# Patient Record
Sex: Male | Born: 1985 | Marital: Married | Smoking: Former smoker
Health system: Southern US, Community
[De-identification: ages and names within clinical notes are randomized; demographics above are authoritative.]

## PROBLEM LIST (undated history)

## (undated) DIAGNOSIS — E669 Obesity, unspecified: Secondary | ICD-10-CM

## (undated) DIAGNOSIS — E785 Hyperlipidemia, unspecified: Secondary | ICD-10-CM

## (undated) DIAGNOSIS — R0683 Snoring: Secondary | ICD-10-CM

## (undated) DIAGNOSIS — Z8679 Personal history of other diseases of the circulatory system: Secondary | ICD-10-CM

## (undated) DIAGNOSIS — G4733 Obstructive sleep apnea (adult) (pediatric): Secondary | ICD-10-CM

## (undated) DIAGNOSIS — E119 Type 2 diabetes mellitus without complications: Secondary | ICD-10-CM

## (undated) HISTORY — DX: Snoring: R06.83

## (undated) HISTORY — DX: Type 2 diabetes mellitus without complications: E11.9

## (undated) HISTORY — DX: Obesity, unspecified: E66.9

## (undated) HISTORY — DX: Obstructive sleep apnea (adult) (pediatric): G47.33

## (undated) HISTORY — PX: OTHER SURGICAL HISTORY: SHX169

## (undated) HISTORY — DX: Personal history of other diseases of the circulatory system: Z86.79

## (undated) HISTORY — DX: Hyperlipidemia, unspecified: E78.5

---
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
## 2015-12-29 DIAGNOSIS — Z8679 Personal history of other diseases of the circulatory system: Secondary | ICD-10-CM

## 2015-12-29 HISTORY — DX: Personal history of other diseases of the circulatory system: Z86.79

## 2017-08-03 DIAGNOSIS — G4730 Sleep apnea, unspecified: Secondary | ICD-10-CM | POA: Diagnosis not present

## 2017-08-03 DIAGNOSIS — Z7689 Persons encountering health services in other specified circumstances: Secondary | ICD-10-CM | POA: Diagnosis not present

## 2017-08-03 DIAGNOSIS — R600 Localized edema: Secondary | ICD-10-CM | POA: Diagnosis not present

## 2017-08-04 DIAGNOSIS — Z7689 Persons encountering health services in other specified circumstances: Secondary | ICD-10-CM | POA: Diagnosis not present

## 2017-08-04 DIAGNOSIS — R600 Localized edema: Secondary | ICD-10-CM | POA: Diagnosis not present

## 2017-10-18 DIAGNOSIS — R400 Somnolence: Secondary | ICD-10-CM | POA: Diagnosis not present

## 2017-10-18 DIAGNOSIS — R0683 Snoring: Secondary | ICD-10-CM | POA: Diagnosis not present

## 2017-12-24 ENCOUNTER — Telehealth: Payer: Self-pay

## 2017-12-24 NOTE — Telephone Encounter (Signed)
Spoke with pt. And instructed if he has any shortness of breath or worsening of swelling to go to ED. Verbalizes understanding.

## 2017-12-27 ENCOUNTER — Ambulatory Visit: Payer: Self-pay | Admitting: Family Medicine

## 2018-01-03 ENCOUNTER — Ambulatory Visit: Payer: Self-pay | Admitting: Family Medicine

## 2018-01-03 ENCOUNTER — Ambulatory Visit (HOSPITAL_BASED_OUTPATIENT_CLINIC_OR_DEPARTMENT_OTHER)
Admission: RE | Admit: 2018-01-03 | Discharge: 2018-01-03 | Disposition: A | Discharge status: Home / Self Care | Payer: 59 | Source: Ambulatory Visit | Attending: Medical | Admitting: Medical

## 2018-01-03 ENCOUNTER — Ambulatory Visit (INDEPENDENT_AMBULATORY_CARE_PROVIDER_SITE_OTHER): Payer: 59 | Admitting: Medical

## 2018-01-03 ENCOUNTER — Encounter: Payer: Self-pay | Admitting: Medical

## 2018-01-03 VITALS — BP 135/82 | HR 86 | Temp 98.3°F | Resp 16 | Ht 68.0 in | Wt 376.6 lb

## 2018-01-03 DIAGNOSIS — R0600 Dyspnea, unspecified: Secondary | ICD-10-CM | POA: Diagnosis not present

## 2018-01-03 DIAGNOSIS — R5383 Other fatigue: Secondary | ICD-10-CM | POA: Diagnosis not present

## 2018-01-03 DIAGNOSIS — R0683 Snoring: Secondary | ICD-10-CM

## 2018-01-03 DIAGNOSIS — M25473 Effusion, unspecified ankle: Secondary | ICD-10-CM

## 2018-01-03 DIAGNOSIS — R0602 Shortness of breath: Secondary | ICD-10-CM | POA: Diagnosis not present

## 2018-01-03 DIAGNOSIS — R918 Other nonspecific abnormal finding of lung field: Secondary | ICD-10-CM | POA: Diagnosis not present

## 2018-01-03 LAB — COMPREHENSIVE METABOLIC PANEL
ALT: 27 U/L (ref 0–53)
AST: 19 U/L (ref 0–37)
Albumin: 4.1 g/dL (ref 3.5–5.2)
Alkaline Phosphatase: 67 U/L (ref 39–117)
BUN: 11 mg/dL (ref 6–23)
CHLORIDE: 99 meq/L (ref 96–112)
CO2: 30 meq/L (ref 19–32)
CREATININE: 0.8 mg/dL (ref 0.40–1.50)
Calcium: 9.4 mg/dL (ref 8.4–10.5)
GFR: 119.67 mL/min (ref >60.00)
Glucose, Bld: 143 mg/dL — ABNORMAL HIGH (ref 70–99)
POTASSIUM: 3.7 meq/L (ref 3.5–5.1)
Sodium: 137 mEq/L (ref 135–145)
Total Bilirubin: 0.5 mg/dL (ref 0.2–1.2)
Total Protein: 7.7 g/dL (ref 6.0–8.3)

## 2018-01-03 LAB — T4, FREE: FREE T4: 0.7 ng/dL (ref 0.60–1.60)

## 2018-01-03 LAB — BRAIN NATRIURETIC PEPTIDE: Pro B Natriuretic peptide (BNP): 8 pg/mL (ref 0.0–100.0)

## 2018-01-03 LAB — TSH: TSH: 3.07 u[IU]/mL (ref 0.35–4.50)

## 2018-01-03 NOTE — Progress Notes (Signed)
Subjective:    Patient ID: Mario Livingston, male    DOB: March 10, 1986, 32 y.o.   MRN: [id number]  HPI  Pt in for first time.  Pt is married with one daughter. Works [employer]. Does not exercise.  Pt has some bilateral ankle swelling. Pt was given lasix. But he states urinates too much. When he props his legs up his legs will go down. Pt never got chest xray in past.  Pt has gained some weight over last 2 years has gained some weight. He gained weight when his dad was sick. He was stressed a lot and was eating a lot/poorly at that time. Also now has sedentary job.  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  Pt snores a lot and thinks sleep apnea. He stops breathing at night per his wife. He will dose off sometimes early in am after awaking. Pt had sleep study scheduled but then was canceled at last minute.(study set up by his neurologist but insured declined).  Former smoker and stopped more than a year.  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   Review of Systems  Constitutional: Positive for fatigue. Negative for chills and fever.  Respiratory: Negative for cough, chest tightness, shortness of breath and wheezing.        Snoring.  Cardiovascular: Negative for chest pain and palpitations.  Gastrointestinal: Negative for abdominal pain.  Musculoskeletal: Negative for back pain.       Pedal edema.   Skin: Negative for rash.  Neurological: Negative for dizziness, speech difficulty, weakness, numbness and headaches.  Hematological: Negative for adenopathy. Does not bruise/bleed easily.  Psychiatric/Behavioral: Positive for sleep disturbance. Negative for behavioral problems, confusion, decreased concentration, dysphoric mood and suicidal ideas. The patient is not nervous/anxious.     No past medical history on file.   Social History   Socioeconomic History  . Marital status: Married    Spouse name: Not on file  . Number of children: Not on file  .  Years of education: Not on file  . Highest education level: Not on file  Social Needs  . Financial resource strain: Not on file  . Food insecurity - worry: Not on file  . Food insecurity - inability: Not on file  . Transportation needs - medical: Not on file  . Transportation needs - non-medical: Not on file  Occupational History  . Not on file  Tobacco Use  . Smoking status: Former Games developermoker  . Smokeless tobacco: Never Used  Substance and Sexual Activity  . Alcohol use: No    Frequency: Never  . Drug use: Not on file  . Sexual activity: Not on file  Other Topics Concern  . Not on file  Social History Narrative  . Not on file      Family History  Problem Relation Age of Onset  . Cancer Father   . Hypertension Father   . Hypertension Brother     Not on File  No current outpatient medications on file prior to visit.   No current facility-administered medications on file prior to visit.     BP (!) 141/83   Pulse 86   Temp 98.3 F (36.8 C) (Oral)   Resp 16   Ht 5\' 8"  (1.727 m)   Wt (!) 376 lb 9.6 oz (170.8 kg)   SpO2 98%   BMI 57.26 kg/m     Objective:   Physical Exam   General Mental Status- Alert. General Appearance- Not  in acute distress.   Skin General: Color- Normal Color. Moisture- Normal Moisture.  Neck Carotid Arteries- Normal color. Moisture- Normal Moisture. No carotid bruits. No JVD.  Chest and Lung Exam Auscultation: Breath Sounds:-Normal.  Cardiovascular Auscultation:Rythm- Regular. Murmurs & Other Heart Sounds:Auscultation of the heart reveals- No Murmurs.  Abdomen Inspection:-Inspeection Normal. Palpation/Percussion:Note:No mass. Palpation and Percussion of the abdomen reveal- Non Tender, Non Distended + BS, no rebound or guarding.   Neurologic Cranial Nerve exam:- CN III-XII intact(No nystagmus), symmetric smile. Strength:- 5/5 equal and symmetric strength both upper and lower extremities.   Lower ext- 1-2 + pedal edema  around ankles.    Assessment & Plan:  For your snoring history will refer to pulmonologist for sleep apnea evaluation.  For obesity start weight loss program. Diet and exercise. Might rx med in future or refer to weight loss specialist.  For fatigue will get cbc, cmp tsh and t4.  For sob on activity will get cxr and bnp. Continue lasix.  Follow up 3-4 weeks or as needed  Evrett Hakim, Ramon Dredge, PA-C

## 2018-01-03 NOTE — Patient Instructions (Addendum)
For your snoring history will refer to pulmonologist for sleep apnea evaluation.  For obesity start weight loss program. Diet and exercise. Might rx med in future or refer to weight loss specialist.  For fatigue will get cbc, cmp tsh and t4.  For sob on activivity will get cxr and bnp. Continue lasix.  Follow up 3-4 weeks or as needed

## 2018-01-24 ENCOUNTER — Encounter: Payer: Self-pay | Admitting: Medical

## 2018-01-24 ENCOUNTER — Ambulatory Visit (INDEPENDENT_AMBULATORY_CARE_PROVIDER_SITE_OTHER): Payer: 59 | Admitting: Medical

## 2018-01-24 VITALS — BP 134/78 | HR 87 | Temp 98.3°F | Resp 16 | Ht 68.0 in | Wt 371.6 lb

## 2018-01-24 DIAGNOSIS — R0683 Snoring: Secondary | ICD-10-CM | POA: Diagnosis not present

## 2018-01-24 DIAGNOSIS — R739 Hyperglycemia, unspecified: Secondary | ICD-10-CM

## 2018-01-24 DIAGNOSIS — E785 Hyperlipidemia, unspecified: Secondary | ICD-10-CM

## 2018-01-24 LAB — LIPID PANEL
CHOL/HDL RATIO: 4
CHOLESTEROL: 157 mg/dL (ref 0–200)
HDL: 36.1 mg/dL — ABNORMAL LOW (ref >39.00)
LDL CALC: 110 mg/dL — AB (ref 0–99)
NONHDL: 120.61
Triglycerides: 52 mg/dL (ref 0.0–149.0)
VLDL: 10.4 mg/dL (ref 0.0–40.0)

## 2018-01-24 LAB — HEMOGLOBIN A1C: Hgb A1c MFr Bld: 7.3 % — ABNORMAL HIGH (ref 4.6–6.5)

## 2018-01-24 NOTE — Patient Instructions (Addendum)
For your high sugar and obesity recommend continue to diet and exercise. Will get a1c today. But with increased muscle and loss of excess fat you body should sugar more efficiently and you sugars will be better controlled.  Please attend pulmonologist appointment. I am trying to get that number so you can call their office to coordinate study.  Since you are fasting will go ahead and add lipid panel to your studies.(low level cholesterol elevation and told to use fish oil in the past)  Follow up in 3 months or as needed

## 2018-01-24 NOTE — Progress Notes (Signed)
Subjective:    Patient ID: Mario Livingston, male    DOB: [birth year], 32 y.o.   MRN: [id number]  HPI  Pt in for follow up. Pt sugar was 143 on last visit but was not fasting at the time.   Pt weight at home no clothes was 161 lb this am.   Pt has been working out 7 days a week. Working out 30-45 minutes a day. Pt feeling better and has more energy.   Pt states he is sleeping some better.  Pt bp was mild high last time. But bp much improved today 134/78.   Pt is tyring to coordinate referral to pulmonlogist for his sleep study.   Review of Systems  Constitutional: Negative for chills, fatigue and fever.  HENT: Negative for congestion, drooling and facial swelling.   Respiratory: Negative for cough, chest tightness, shortness of breath and wheezing.   Cardiovascular: Negative for chest pain and palpitations.  Gastrointestinal: Negative for abdominal pain.  Endocrine: Negative for polydipsia, polyphagia and polyuria.  Musculoskeletal: Negative for back pain, joint swelling, myalgias and neck stiffness.  Skin: Negative for rash.  Neurological: Negative for dizziness, seizures, speech difficulty, weakness and headaches.  Psychiatric/Behavioral: Negative for agitation, behavioral problems, confusion, hallucinations, self-injury and suicidal ideas. The patient is not nervous/anxious and is not hyperactive.     Past Medical History:  Diagnosis Date  . History of Wolff-Parkinson-White (WPW) syndrome 2017   ablation surgery resolved in 2017.  . Obesity   . Snoring      Social History   Socioeconomic History  . Marital status: Married    Spouse name: Not on file  . Number of children: Not on file  . Years of education: Not on file  . Highest education level: Not on file  Social Needs  . Financial resource strain: Not on file  . Food insecurity - worry: Not on file  . Food insecurity - inability: Not on file  . Transportation needs - medical: Not on file  .  Transportation needs - non-medical: Not on file  Occupational History  . Not on file  Tobacco Use  . Smoking status: Former Games developer  . Smokeless tobacco: Never Used  Substance and Sexual Activity  . Alcohol use: No    Frequency: Never  . Drug use: Not on file  . Sexual activity: Not on file  Other Topics Concern  . Not on file  Social History Narrative  . Not on file    Past Surgical History:  Procedure Laterality Date  . ANKLE RECONSTRUCTION  2004    Family History  Problem Relation Age of Onset  . Cancer Father   . Hypertension Father   . Hypertension Brother     Not on File  Current Outpatient Medications on File Prior to Visit  Medication Sig Dispense Refill  . furosemide (LASIX) 40 MG tablet Take 40 mg by mouth daily.     No current facility-administered medications on file prior to visit.     BP 134/78   Pulse 87   Temp 98.3 F (36.8 C) (Oral)   Resp 16   Ht 5\' 8"  (1.727 m)   Wt (!) 371 lb 9.6 oz (168.6 kg)   SpO2 97%   BMI 56.50 kg/m       Objective:   Physical Exam  General Mental Status- Alert. General Appearance- Not in acute distress.   Skin General: Color- Normal Color. Moisture- Normal Moisture.  Neck Carotid Arteries- Normal color. Moisture- Normal  Moisture. No carotid bruits. No JVD.  Chest and Lung Exam Auscultation: Breath Sounds:-Normal.  Cardiovascular Auscultation:Rythm- Regular. Murmurs & Other Heart Sounds:Auscultation of the heart reveals- No Murmurs.  Abdomen Inspection:-Inspeection Normal. Palpation/Percussion:Note:No mass. Palpation and Percussion of the abdomen reveal- Non Tender, Non Distended + BS, no rebound or guarding.    Neurologic Cranial Nerve exam:- CN III-XII intact(No nystagmus), symmetric smile. Strength:- 5/5 equal and symmetric strength both upper and lower extremities.      Assessment & Plan:  For your high sugar and obesity recommend continue to diet and exercise. Will get a1c today. But with  increased muscle and loss of excess fat you body should utilize sugar  more efficiently and you sugars will be better controlled.  Please attend pulmonologist appointment. I am trying to get that number so you can call their office to coordinate study.  Since you are fasting will go ahead and add lipid panel to your studies.(low level cholesterol elevation and told to use fish oil in the past)  Follow up in 3 months or as needed  Whole FoodsEdward Demarrius Guerrero, VF CorporationPA-C

## 2018-04-03 DIAGNOSIS — H6642 Suppurative otitis media, unspecified, left ear: Secondary | ICD-10-CM | POA: Diagnosis not present

## 2018-04-22 ENCOUNTER — Institutional Professional Consult (permissible substitution): Payer: 59 | Admitting: Pulmonary Disease

## 2018-05-02 ENCOUNTER — Ambulatory Visit: Payer: 59 | Admitting: Medical

## 2018-06-07 ENCOUNTER — Institutional Professional Consult (permissible substitution): Payer: 59 | Admitting: Pulmonary Disease

## 2018-07-27 ENCOUNTER — Encounter: Payer: Self-pay | Admitting: Pulmonary Disease

## 2018-07-27 ENCOUNTER — Ambulatory Visit (INDEPENDENT_AMBULATORY_CARE_PROVIDER_SITE_OTHER): Payer: 59 | Admitting: Pulmonary Disease

## 2018-07-27 VITALS — BP 138/86 | HR 97 | Ht 68.0 in | Wt 368.8 lb

## 2018-07-27 DIAGNOSIS — Z6841 Body Mass Index (BMI) 40.0 and over, adult: Secondary | ICD-10-CM | POA: Diagnosis not present

## 2018-07-27 DIAGNOSIS — R0683 Snoring: Secondary | ICD-10-CM

## 2018-07-27 NOTE — Progress Notes (Signed)
   Subjective:    Patient ID: Marcha DuttonJoshua Juan Dittrich, male    DOB: [birth year], 32 y.o.   MRN: [id number]  HPI    Review of Systems  Constitutional: Negative for fever and unexpected weight change.  HENT: Negative for congestion, dental problem, ear pain, nosebleeds, postnasal drip, rhinorrhea, sinus pressure, sneezing, sore throat and trouble swallowing.   Eyes: Negative for redness and itching.  Respiratory: Negative for cough, chest tightness, shortness of breath and wheezing.   Cardiovascular: Negative for palpitations and leg swelling.  Gastrointestinal: Negative for nausea and vomiting.  Genitourinary: Negative for dysuria.  Musculoskeletal: Negative for joint swelling.  Skin: Negative for rash.  Allergic/Immunologic: Negative.  Negative for environmental allergies, food allergies and immunocompromised state.  Neurological: Negative for headaches.  Hematological: Does not bruise/bleed easily.  Psychiatric/Behavioral: Negative for dysphoric mood. The patient is not nervous/anxious.        Objective:   Physical Exam        Assessment & Plan:

## 2018-07-27 NOTE — Patient Instructions (Signed)
Will arrange for home sleep study Will call to arrange for follow up after sleep study reviewed  

## 2018-07-27 NOTE — Progress Notes (Signed)
[facility]  Chief Complaint  Patient presents with  . Sleep Consult    Constitutional: BP 138/86 (BP Location: Left Arm, Cuff Size: Normal)   Pulse 97   Ht 5\' 8"  (1.727 m)   Wt (!) 368 lb 12.8 oz (167.3 kg)   SpO2 100%   BMI 56.08 kg/m   History of Present Illness: Mario Livingston is a 32 y.o. male with snoring.  He has noticed trouble with snoring for years.  This has been getting worse.  His wife says he stops breathing.  He can't sleep on his back.  His mouth gets dry at night.  He starts to doze when watching TV.  He goes to sleep at 9 pm.  He falls asleep in 30 minutes.  He wakes up 2 or 3 times to use the bathroom.  He gets out of bed at 345 am.  He feels tired in the morning.  He denies morning headache.  He does not use anything to help him fall sleep or stay awake.  He denies sleep walking, sleep talking, bruxism, or nightmares.  There is no history of restless legs.  He denies sleep hallucinations, sleep paralysis, or cataplexy.  The Epworth score is 10 out of 24.   Comprehensive Respiratory Exam:  Appearance - well kempt  ENMT - nasal mucosa moist, turbinates clear, midline nasal septum, no dental lesions, no gingival bleeding, no oral exudates, no tonsillar hypertrophy, MP 4, enlarged tongue Neck - no masses, trachea midline, no thyromegaly, no elevation in JVP Respiratory - normal appearance of chest wall, normal respiratory effort w/o accessory muscle use, no dullness on percussion, no tactile fremitus, no wheezing or rales CV - s1s2 regular rate and rhythm, no murmurs, no peripheral edema, no varicosities, radial pulses symmetric GI - soft, non tender, no masses, no hepatosplenomegaly Lymph - no adenopathy noted in neck and axillary areas MSK - normal muscle strength and tone, normal gait Ext - no cyanosis, clubbing, or joint inflammation noted Skin - no rashes, lesions, or ulcers Neuro - oriented to person, place,  and time Psych - normal mood and affect  Discussion: He has snoring, sleep disruption, apnea, and daytime sleepiness.  His BMI is > 35.  I am concerned he could have sleep apnea.  Assessment/Plan:   Snoring with excessive daytime sleepiness. - will need to arrange for a home sleep study  Obesity. - discussed how weight can impact sleep and risk for sleep disordered breathing - discussed options to assist with weight loss: combination of diet modification, cardiovascular and strength training exercises  Cardiovascular risk. - had an extensive discussion regarding the adverse health consequences related to untreated sleep disordered breathing - specifically discussed the risks for hypertension, coronary artery disease, cardiac dysrhythmias, cerebrovascular disease, and diabetes - lifestyle modification discussed  Safe driving practices. - discussed how sleep disruption can increase risk of accidents, particularly when driving - safe driving practices were discussed  Therapies for obstructive sleep apnea. - if the sleep study shows significant sleep apnea, then various therapies for treatment were reviewed: CPAP, oral appliance, and surgical interventions    Patient Instructions  Will arrange for home sleep study Will call to arrange for follow up after sleep study reviewed     Coralyn Helling, MD [facility] 07/27/2018, 4:17 PM  Flow Sheet  Sleep tests:  Review of Systems: Constitutional: Negative for fever and unexpected weight change.  HENT: Negative for congestion, dental problem, ear pain, nosebleeds,  postnasal drip, rhinorrhea, sinus pressure, sneezing, sore throat and trouble swallowing.   Eyes: Negative for redness and itching.  Respiratory: Negative for cough, chest tightness, shortness of breath and wheezing.   Cardiovascular: Negative for palpitations and leg swelling.  Gastrointestinal: Negative for nausea and vomiting.  Genitourinary:  Negative for dysuria.  Musculoskeletal: Negative for joint swelling.  Skin: Negative for rash.  Allergic/Immunologic: Negative.  Negative for environmental allergies, food allergies and immunocompromised state.  Neurological: Negative for headaches.  Hematological: Does not bruise/bleed easily.  Psychiatric/Behavioral: Negative for dysphoric mood. The patient is not nervous/anxious.    Past Medical History: He  has a past medical history of History of Wolff-Parkinson-White (WPW) syndrome (2017), Obesity, and Snoring.  Past Surgical History: He  has a past surgical history that includes Ankle reconstruction (2004).  Family History: His family history includes Cancer in his father; Hypertension in his brother and father.  Social History: He  reports that he has quit smoking. He has never used smokeless tobacco. He reports that he does not drink alcohol.  Medications: Allergies as of 07/27/2018   Not on File     Medication List        Accurate as of 07/27/18  4:17 PM. Always use your most recent med list.          ADULT NUTRITIONAL SUPPLEMENT + PO Take 2 capsules by mouth daily. Ketoxol supplement   furosemide 40 MG tablet Commonly known as:  LASIX Take 40 mg by mouth daily.

## 2018-08-16 DIAGNOSIS — G4733 Obstructive sleep apnea (adult) (pediatric): Secondary | ICD-10-CM | POA: Diagnosis not present

## 2018-08-17 ENCOUNTER — Other Ambulatory Visit: Payer: Self-pay | Admitting: *Deleted

## 2018-08-17 DIAGNOSIS — R0683 Snoring: Secondary | ICD-10-CM

## 2018-08-23 ENCOUNTER — Encounter: Payer: Self-pay | Admitting: Pulmonary Disease

## 2018-08-23 ENCOUNTER — Telehealth: Payer: Self-pay | Admitting: Pulmonary Disease

## 2018-08-23 DIAGNOSIS — G4733 Obstructive sleep apnea (adult) (pediatric): Secondary | ICD-10-CM | POA: Diagnosis not present

## 2018-08-23 HISTORY — DX: Obstructive sleep apnea (adult) (pediatric): G47.33

## 2018-08-23 NOTE — Telephone Encounter (Signed)
Called and spoke with patient regarding results.  Informed the patient of results and recommendations today. Scheduled ROV with VS for 08/31/2018 at 2:45pm to discuss HST results. Pt verbalized understanding and denied any questions or concerns at this time.  Nothing further needed.

## 2018-08-23 NOTE — Telephone Encounter (Signed)
HST 08/16/18 >> AHI 81.7, SpO2 low 63%.   Will have my nurse inform pt that sleep study shows severe sleep apnea.  Please schedule ROV with NP to discuss tx options >> auto CPAP versus CPAP titration study.

## 2018-08-31 ENCOUNTER — Ambulatory Visit (INDEPENDENT_AMBULATORY_CARE_PROVIDER_SITE_OTHER): Payer: 59 | Admitting: Pulmonary Disease

## 2018-08-31 ENCOUNTER — Encounter: Payer: Self-pay | Admitting: Pulmonary Disease

## 2018-08-31 VITALS — BP 130/82 | HR 104 | Ht 69.0 in | Wt 376.0 lb

## 2018-08-31 DIAGNOSIS — G4733 Obstructive sleep apnea (adult) (pediatric): Secondary | ICD-10-CM | POA: Diagnosis not present

## 2018-08-31 DIAGNOSIS — Z6841 Body Mass Index (BMI) 40.0 and over, adult: Secondary | ICD-10-CM | POA: Diagnosis not present

## 2018-08-31 NOTE — Patient Instructions (Signed)
Will arrange for auto CPAP set up Follow up in 2 months after CPAP set up 

## 2018-08-31 NOTE — Progress Notes (Signed)
[facility]  Chief Complaint  Patient presents with  . Follow-up    Pt requesting to discuss HST results further in depth.    Constitutional: BP 130/82 (BP Location: Left Arm, Cuff Size: Normal)   Pulse (!) 104   Ht 5\' 9"  (1.753 m)   Wt (!) 376 lb (170.6 kg)   SpO2 97%   BMI 55.53 kg/m   History of Present Illness: Mario Livingston is a 32 y.o. male with obstructive sleep apnea.  He is here to review his sleep study.  Showed severe sleep apnea.  He is working on a plan to get his weight down.  Comprehensive Respiratory Exam:  Appearance - well kempt  ENMT - nasal mucosa moist, turbinates clear, midline nasal septum, no dental lesions, no gingival bleeding, no oral exudates, no tonsillar hypertrophy, MP 4, enlarged tongue Neck - no masses, trachea midline, no thyromegaly, no elevation in JVP Respiratory - normal appearance of chest wall, normal respiratory effort w/o accessory muscle use, no dullness on percussion, no wheezing or rales CV - s1s2 regular rate and rhythm, no murmurs, no peripheral edema, radial pulses symmetric GI - soft, non tender Lymph - no adenopathy noted in neck and axillary areas MSK - normal muscle strength and tone, normal gait Ext - no cyanosis, clubbing, or joint inflammation noted Skin - no rashes, lesions, or ulcers Neuro - oriented to person, place, and time Psych - normal mood and affect  Assessment/Plan:  Obstructive sleep apnea. - reviewed his sleep study with him - discussed different treatment options - will arrange for auto CPAP set up and ONO with CPAP - explained he might need in lab titration study at some point  Obesity. - reviewed different options to assist with weight loss regarding diet modification and gradual exercise regimen   Patient Instructions  Will arrange for auto CPAP set up  Follow up in 2 months after CPAP set up    Coralyn Helling, MD [facility] 08/31/2018, 3:42 PM  Flow Sheet  Sleep tests: HST 08/16/18 >> AHI 81.7, SpO2 low 63%.  Past Medical History: He  has a past medical history of History of Wolff-Parkinson-White (WPW) syndrome (2017), Obesity, OSA (obstructive sleep apnea) (08/23/2018), and Snoring.  Past Surgical History: He  has a past surgical history that includes Ankle reconstruction (2004).  Family History: His family history includes Cancer in his father; Hypertension in his brother and father.  Social History: He  reports that he has quit smoking. He has never used smokeless tobacco. He reports that he does not drink alcohol or use drugs.  Medications: Allergies as of 08/31/2018   Not on File     Medication List        Accurate as of 08/31/18  3:42 PM. Always use your most recent med list.          ADULT NUTRITIONAL SUPPLEMENT + PO Take 2 capsules by mouth daily. Ketoxol supplement   furosemide 40 MG tablet Commonly known as:  LASIX Take 40 mg by mouth daily.

## 2018-09-13 DIAGNOSIS — G4733 Obstructive sleep apnea (adult) (pediatric): Secondary | ICD-10-CM | POA: Diagnosis not present

## 2018-10-13 DIAGNOSIS — G4733 Obstructive sleep apnea (adult) (pediatric): Secondary | ICD-10-CM | POA: Diagnosis not present

## 2018-10-31 ENCOUNTER — Ambulatory Visit: Payer: 59 | Admitting: Pulmonary Disease

## 2018-11-13 DIAGNOSIS — G4733 Obstructive sleep apnea (adult) (pediatric): Secondary | ICD-10-CM | POA: Diagnosis not present

## 2018-11-25 DIAGNOSIS — G4733 Obstructive sleep apnea (adult) (pediatric): Secondary | ICD-10-CM | POA: Diagnosis not present

## 2018-12-13 DIAGNOSIS — G4733 Obstructive sleep apnea (adult) (pediatric): Secondary | ICD-10-CM | POA: Diagnosis not present

## 2019-01-13 DIAGNOSIS — G4733 Obstructive sleep apnea (adult) (pediatric): Secondary | ICD-10-CM | POA: Diagnosis not present

## 2019-02-13 DIAGNOSIS — G4733 Obstructive sleep apnea (adult) (pediatric): Secondary | ICD-10-CM | POA: Diagnosis not present

## 2019-03-14 DIAGNOSIS — G4733 Obstructive sleep apnea (adult) (pediatric): Secondary | ICD-10-CM | POA: Diagnosis not present

## 2019-04-14 DIAGNOSIS — G4733 Obstructive sleep apnea (adult) (pediatric): Secondary | ICD-10-CM | POA: Diagnosis not present

## 2019-04-18 DIAGNOSIS — G4733 Obstructive sleep apnea (adult) (pediatric): Secondary | ICD-10-CM | POA: Diagnosis not present

## 2019-05-14 DIAGNOSIS — G4733 Obstructive sleep apnea (adult) (pediatric): Secondary | ICD-10-CM | POA: Diagnosis not present

## 2020-07-05 ENCOUNTER — Emergency Department (HOSPITAL_BASED_OUTPATIENT_CLINIC_OR_DEPARTMENT_OTHER)
Admission: EM | Admit: 2020-07-05 | Discharge: 2020-07-05 | Disposition: A | Discharge status: Home / Self Care | Payer: 59 | Attending: Emergency Medicine | Admitting: Emergency Medicine

## 2020-07-05 ENCOUNTER — Encounter (HOSPITAL_BASED_OUTPATIENT_CLINIC_OR_DEPARTMENT_OTHER): Payer: Self-pay | Admitting: *Deleted

## 2020-07-05 ENCOUNTER — Emergency Department (HOSPITAL_BASED_OUTPATIENT_CLINIC_OR_DEPARTMENT_OTHER): Payer: 59

## 2020-07-05 ENCOUNTER — Other Ambulatory Visit: Payer: Self-pay

## 2020-07-05 DIAGNOSIS — R079 Chest pain, unspecified: Secondary | ICD-10-CM | POA: Diagnosis present

## 2020-07-05 DIAGNOSIS — R072 Precordial pain: Secondary | ICD-10-CM | POA: Insufficient documentation

## 2020-07-05 DIAGNOSIS — R000 Tachycardia, unspecified: Secondary | ICD-10-CM | POA: Diagnosis not present

## 2020-07-05 DIAGNOSIS — Z87891 Personal history of nicotine dependence: Secondary | ICD-10-CM | POA: Insufficient documentation

## 2020-07-05 LAB — TROPONIN I (HIGH SENSITIVITY)
Troponin I (High Sensitivity): 6 ng/L (ref <18)
Troponin I (High Sensitivity): 6 ng/L (ref <18)

## 2020-07-05 LAB — BASIC METABOLIC PANEL
Anion gap: 12 (ref 5–15)
BUN: 12 mg/dL (ref 6–20)
CO2: 28 mmol/L (ref 22–32)
Calcium: 9.1 mg/dL (ref 8.9–10.3)
Chloride: 96 mmol/L — ABNORMAL LOW (ref 98–111)
Creatinine, Ser: 0.64 mg/dL (ref 0.61–1.24)
GFR calc Af Amer: >60 mL/min (ref >60)
GFR calc non Af Amer: >60 mL/min (ref >60)
Glucose, Bld: 213 mg/dL — ABNORMAL HIGH (ref 70–99)
Potassium: 4.1 mmol/L (ref 3.5–5.1)
Sodium: 136 mmol/L (ref 135–145)

## 2020-07-05 LAB — CBC
HCT: 44.7 % (ref 39.0–52.0)
Hemoglobin: 14 g/dL (ref 13.0–17.0)
MCH: 27.9 pg (ref 26.0–34.0)
MCHC: 31.3 g/dL (ref 30.0–36.0)
MCV: 89.2 fL (ref 80.0–100.0)
Platelets: 367 10*3/uL (ref 150–400)
RBC: 5.01 MIL/uL (ref 4.22–5.81)
RDW: 13.2 % (ref 11.5–15.5)
WBC: 10.9 10*3/uL — ABNORMAL HIGH (ref 4.0–10.5)
nRBC: 0 % (ref 0.0–0.2)

## 2020-07-05 LAB — D-DIMER, QUANTITATIVE: D-Dimer, Quant: 0.37 ug/mL-FEU (ref 0.00–0.50)

## 2020-07-05 MED ORDER — SODIUM CHLORIDE 0.9% FLUSH
3.0000 mL | Freq: Once | INTRAVENOUS | Status: DC
  Filled 2020-07-05: qty 3

## 2020-07-05 MED ORDER — ALUM & MAG HYDROXIDE-SIMETH 400-400-40 MG/5ML PO SUSP: 10.0000 mL | Freq: Four times a day (QID) | ORAL | 0 refills | Status: DC | PRN

## 2020-07-05 MED ORDER — PANTOPRAZOLE SODIUM 40 MG PO TBEC: 40.0000 mg | DELAYED_RELEASE_TABLET | Freq: Every day | ORAL | 0 refills | Status: DC

## 2020-07-05 NOTE — ED Provider Notes (Signed)
Emergency Department Provider Note   I have reviewed the triage vital signs and the nursing notes.   HISTORY  Chief Complaint Chest Pain   HPI Mario Livingston is a 34 y.o. male with past medical history of WPW, obesity, OSA, presents emergency department for evaluation of 2 days of intermittent chest pain.  He describes the discomfort as lasting only a few seconds but sharp and slightly pleuritic.  He has a soreness in the left shoulder which has been slightly more persistent.  Denies fevers or chills.  No shortness of breath.  No active chest pain.  No clear modifying factors or provoking factors.  He states he has had intermittent similar pains in the past but has largely ignored them until today when symptoms seem somewhat worse.  On the way to the emergency department he had some right-sided symptoms as well which has resolved.   Past Medical History:  Diagnosis Date  . History of Wolff-Parkinson-White (WPW) syndrome 2017   ablation surgery resolved in 2017.  . Obesity   . OSA (obstructive sleep apnea) 08/23/2018  . Snoring     Patient Active Problem List   Diagnosis Date Noted  . OSA (obstructive sleep apnea) 08/23/2018    Past Surgical History:  Procedure Laterality Date  . ANKLE RECONSTRUCTION  2004    Allergies Patient has no known allergies.  Family History  Problem Relation Age of Onset  . Cancer Father   . Hypertension Father   . Hypertension Brother     Social History Social History   Tobacco Use  . Smoking status: Former Games developer  . Smokeless tobacco: Never Used  Vaping Use  . Vaping Use: Never used  Substance Use Topics  . Alcohol use: No  . Drug use: Never    Review of Systems  Constitutional: No fever/chills Eyes: No visual changes. ENT: No sore throat. Cardiovascular: Positive chest pain. Respiratory: Denies shortness of breath. Gastrointestinal: No abdominal pain.  No nausea, no vomiting.  No diarrhea.  No  constipation. Genitourinary: Negative for dysuria. Musculoskeletal: Negative for back pain. Skin: Negative for rash. Neurological: Negative for headaches, focal weakness or numbness.  10-point ROS otherwise negative.  ____________________________________________   PHYSICAL EXAM:  VITAL SIGNS: ED Triage Vitals  Enc Vitals Group     BP 07/05/20 1635 (!) 145/94     Pulse Rate 07/05/20 1635 (!) 102     Resp 07/05/20 1635 18     Temp 07/05/20 1635 98.2 F (36.8 C)     Temp Source 07/05/20 1635 Oral     SpO2 07/05/20 1635 99 %     Weight 07/05/20 1631 (!) 378 lb (171.5 kg)     Height 07/05/20 1631 5\' 9"  (1.753 m)   Constitutional: Alert and oriented. Well appearing and in no acute distress. Eyes: Conjunctivae are normal. Head: Atraumatic. Nose: No congestion/rhinnorhea. Mouth/Throat: Mucous membranes are moist.  Neck: No stridor.  Cardiovascular: Tachycardia. Good peripheral circulation. Grossly normal heart sounds.   Respiratory: Normal respiratory effort.  No retractions. Lungs CTAB. Gastrointestinal: Soft and nontender. No epigastric or RUQ tenderness specifically. No distention.  Musculoskeletal: No gross deformities of extremities. Neurologic:  Normal speech and language.  Skin:  Skin is warm, dry and intact. No rash noted.  ____________________________________________   LABS (all labs ordered are listed, but only abnormal results are displayed)  Labs Reviewed  BASIC METABOLIC PANEL - Abnormal; Notable for the following components:      Result Value   Chloride 96 (*)  Glucose, Bld 213 (*)    All other components within normal limits  CBC - Abnormal; Notable for the following components:   WBC 10.9 (*)    All other components within normal limits  D-DIMER, QUANTITATIVE (NOT AT [facility])  TROPONIN I (HIGH SENSITIVITY)  TROPONIN I (HIGH SENSITIVITY)   ____________________________________________  EKG   EKG Interpretation  Date/Time:  Friday July 05 2020  16:34:23 EDT Ventricular Rate:  106 PR Interval:  134 QRS Duration: 74 QT Interval:  330 QTC Calculation: 438 R Axis:   23 Text Interpretation: Sinus tachycardia Otherwise normal ECG No STEMI Confirmed by Alona Bene [phone]) on 07/05/2020 4:37:10 PM       ____________________________________________  RADIOLOGY  DG Chest 2 View  Result Date: 07/05/2020 CLINICAL DATA:  Chest pain EXAM: CHEST - 2 VIEW COMPARISON:  January 03, 2018 FINDINGS: The heart size and mediastinal contours are within normal limits. Both lungs are clear. The visualized skeletal structures are unremarkable. IMPRESSION: No active cardiopulmonary disease. Electronically Signed   By: Gerome Sam III M.D   On: 07/05/2020 17:07    ____________________________________________   PROCEDURES  Procedure(s) performed:   Procedures  None  ____________________________________________   INITIAL IMPRESSION / ASSESSMENT AND PLAN / ED COURSE  Pertinent labs & imaging results that were available during my care of the patient were reviewed by me and considered in my medical decision making (see chart for details).   Patient presents to the emergency department for evaluation of fairly atypical chest discomfort.  Labs and imaging reviewed including D-dimer and delta troponin which are normal.  Suspect possible GI source.  Considered ACS and PE but after testing feel these are exceedingly less likely.  Patient will need close follow-up with his primary care doctor to decide on additional treatment and possible referral either to GI or cardiology as they see fit.  Discussed ED return precautions in detail and patient is comfortable with the plan at discharge.   ____________________________________________  FINAL CLINICAL IMPRESSION(S) / ED DIAGNOSES  Final diagnoses:  Precordial chest pain    NEW OUTPATIENT MEDICATIONS STARTED DURING THIS VISIT:  Discharge Medication List as of 07/05/2020  8:06 PM    START taking  these medications   Details  alum & mag hydroxide-simeth (MAALOX MAX) 400-400-40 MG/5ML suspension Take 10 mLs by mouth every 6 (six) hours as needed for indigestion., Starting Fri 07/05/2020, Normal    pantoprazole (PROTONIX) 40 MG tablet Take 1 tablet (40 mg total) by mouth daily., Starting Fri 07/05/2020, Normal        Note:  This document was prepared using Dragon voice recognition software and may include unintentional dictation errors.  Alona Bene, MD, [facility] Emergency Medicine    Addisson Frate, Arlyss Repress, MD 07/06/20 [phone]

## 2020-07-05 NOTE — ED Triage Notes (Signed)
Chest pain x 2 days. Pain is sharp in nature. Soreness to his left shoulder.

## 2020-07-05 NOTE — Discharge Instructions (Signed)

## 2022-07-07 IMAGING — CR DG CHEST 2V
2 series · 2 of 2 positions shown · non-contrast
Comparison: January 03, 2018

CLINICAL DATA: Chest pain

EXAM:
CHEST - 2 VIEW

[w chest pa]
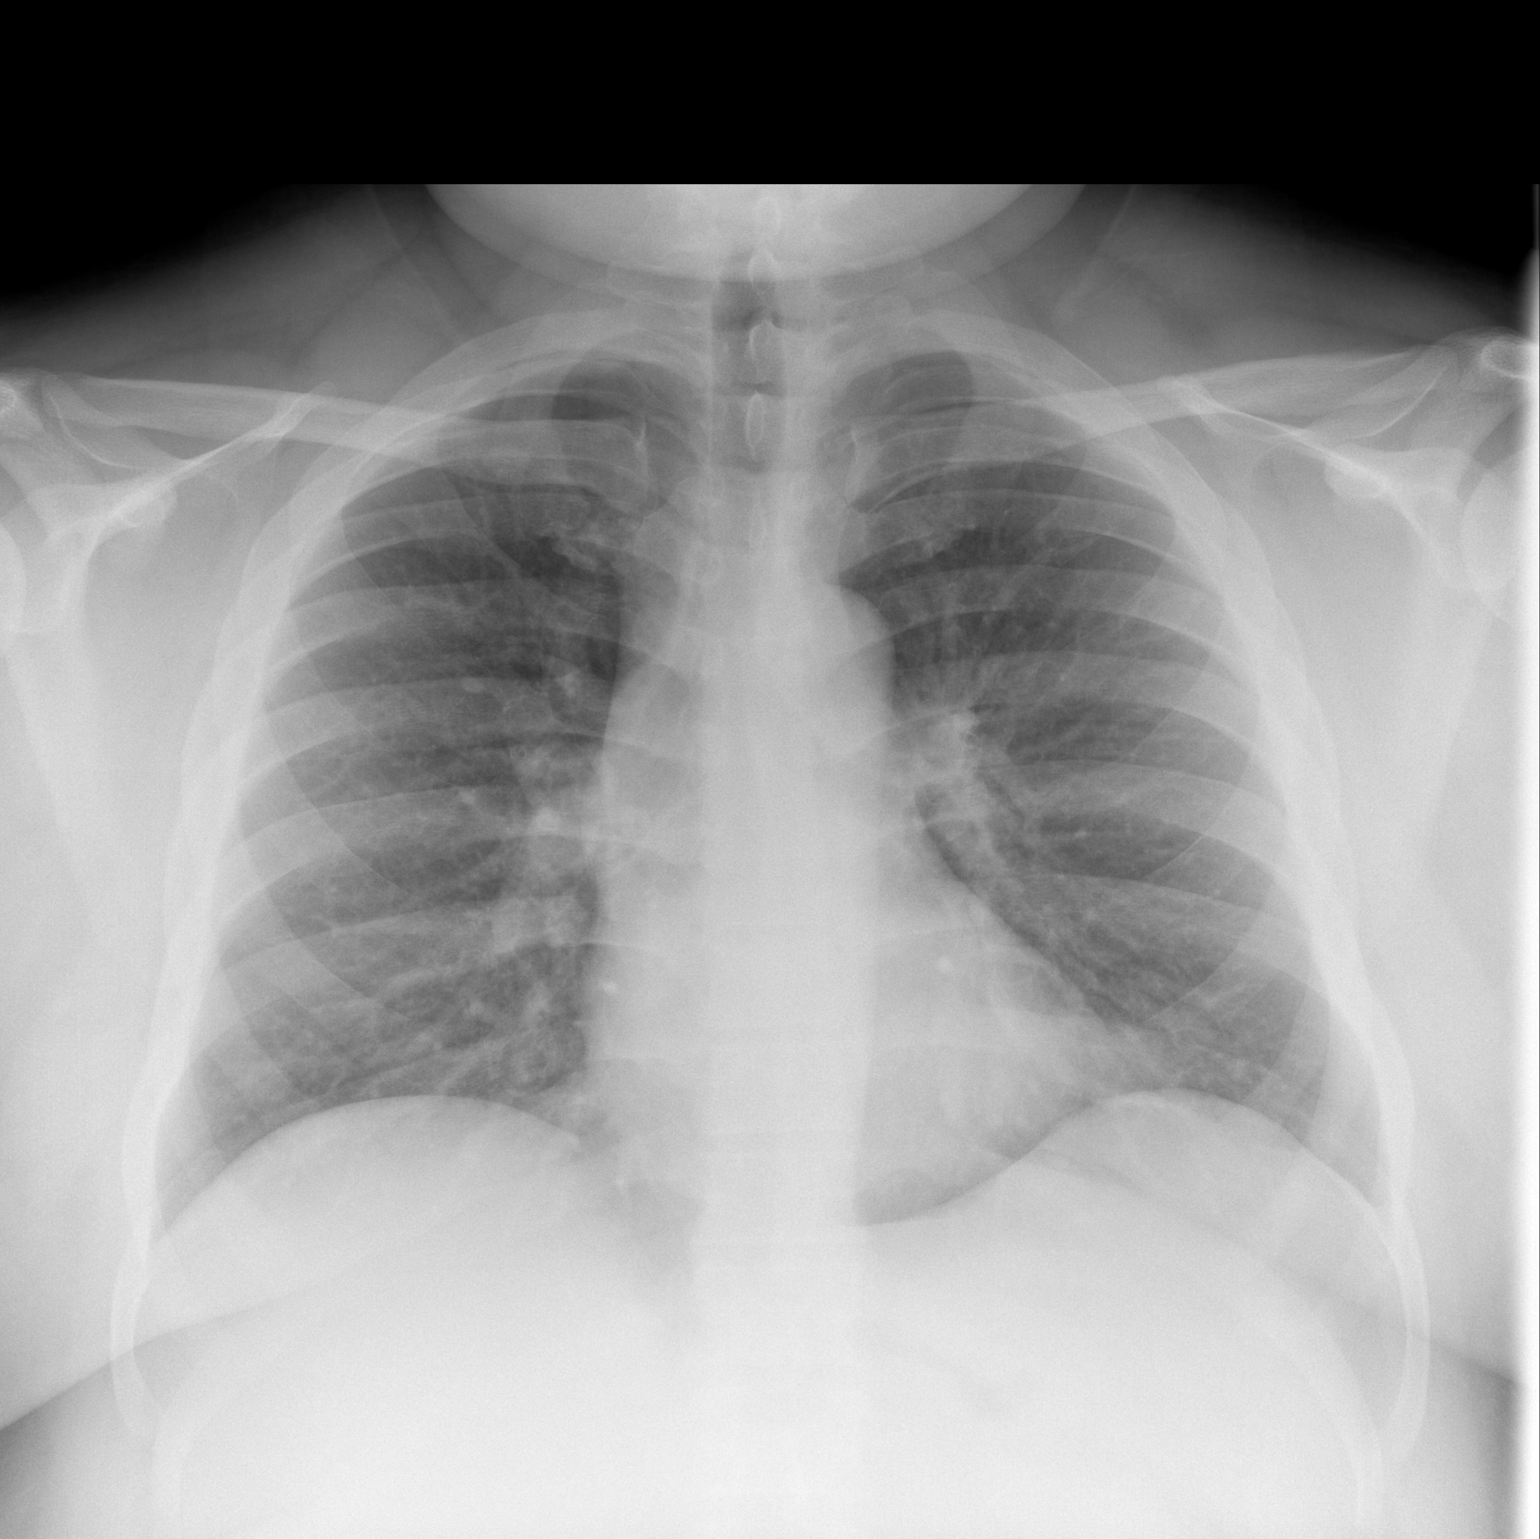

[w chest lat]
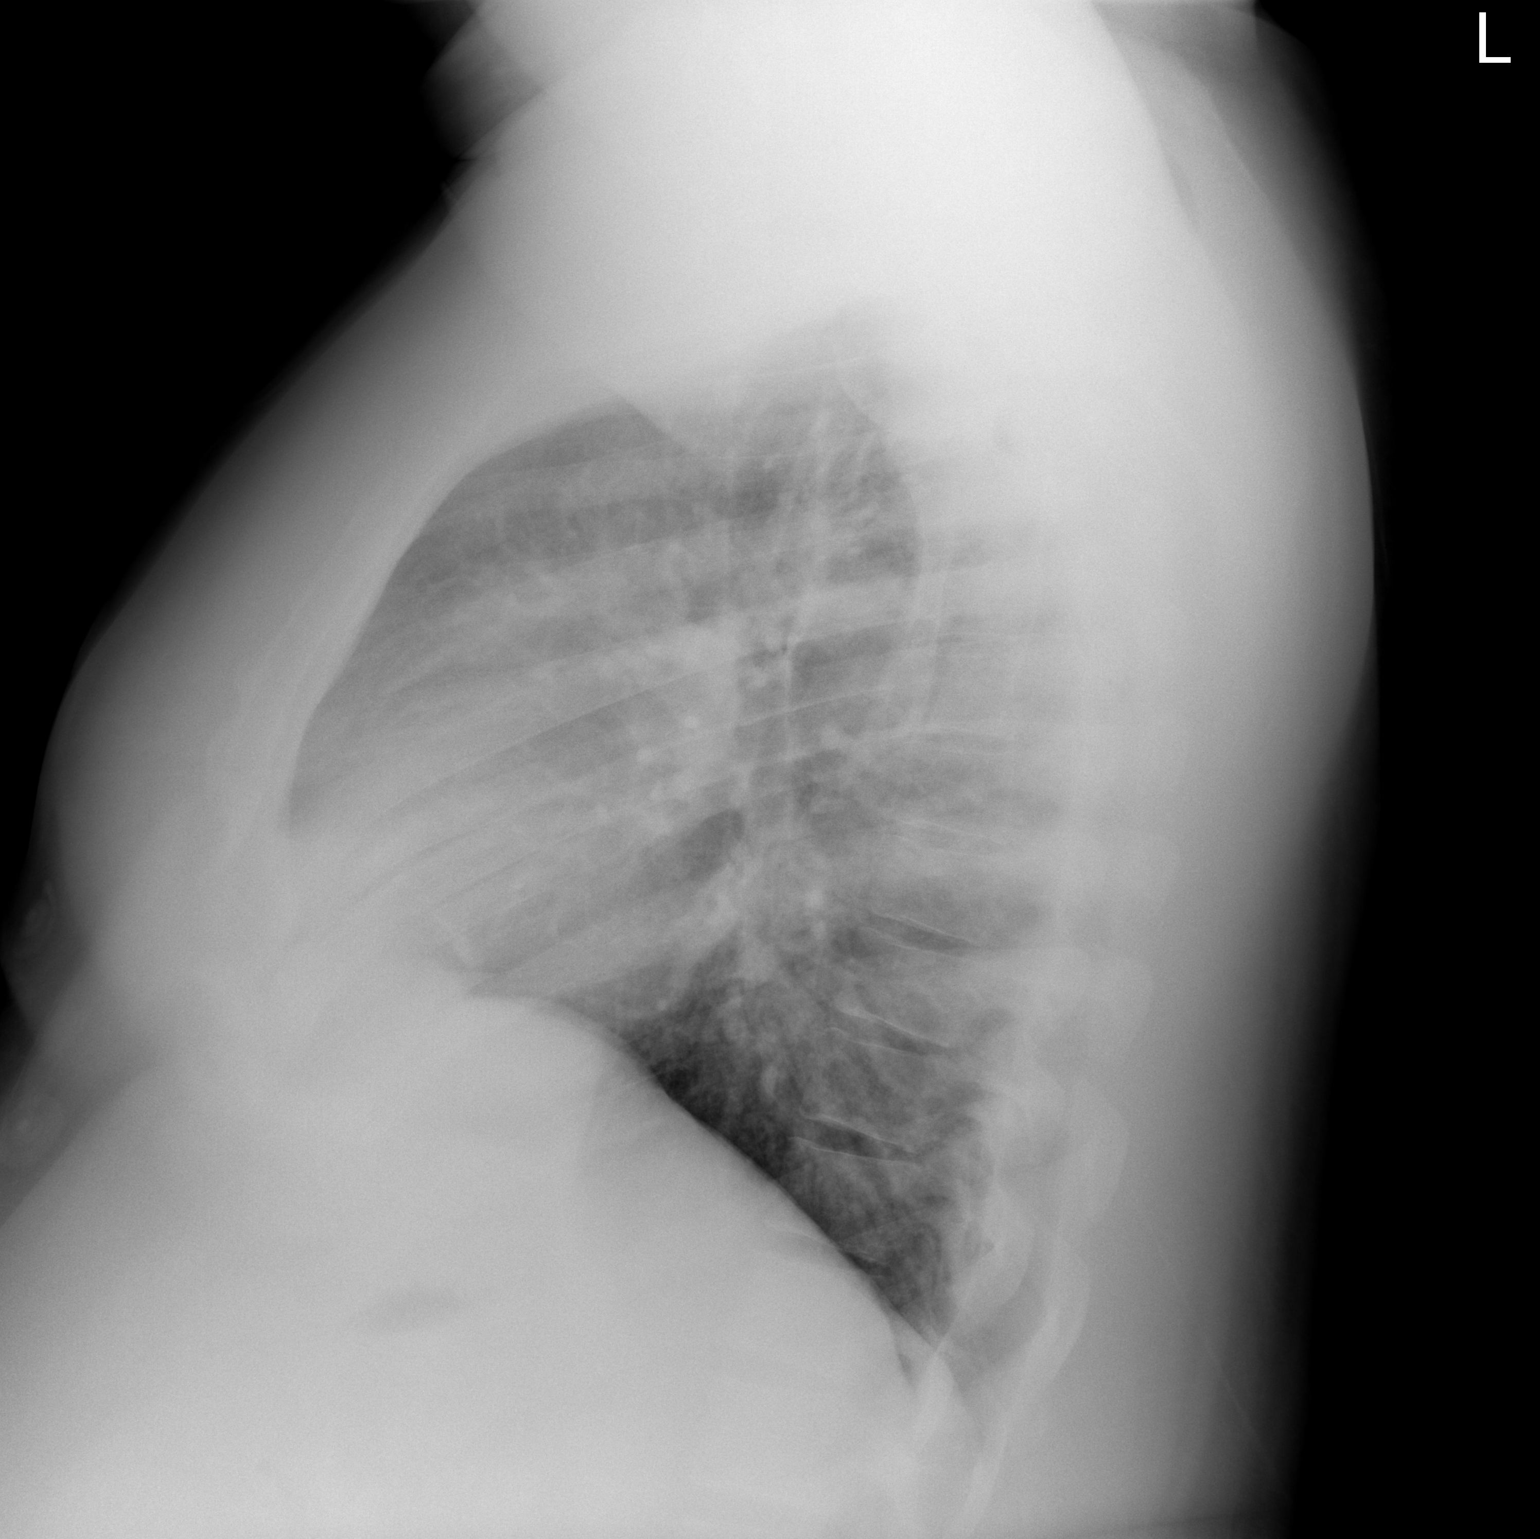

[2 of 2 positions shown; findings below may reference images not displayed]

FINDINGS: The heart size and mediastinal contours are within normal limits.
Both lungs are clear. The visualized skeletal structures are
unremarkable.
IMPRESSION: No active cardiopulmonary disease.

## 2023-07-27 ENCOUNTER — Encounter: Payer: Self-pay | Admitting: Family Medicine

## 2023-07-27 ENCOUNTER — Ambulatory Visit (INDEPENDENT_AMBULATORY_CARE_PROVIDER_SITE_OTHER): Payer: PRIVATE HEALTH INSURANCE | Admitting: Family Medicine

## 2023-07-27 VITALS — BP 130/84 | HR 94 | Temp 98.2°F | Ht 69.0 in | Wt 361.0 lb

## 2023-07-27 DIAGNOSIS — Z6841 Body Mass Index (BMI) 40.0 and over, adult: Secondary | ICD-10-CM | POA: Diagnosis not present

## 2023-07-27 DIAGNOSIS — Z23 Encounter for immunization: Secondary | ICD-10-CM

## 2023-07-27 DIAGNOSIS — Z7984 Long term (current) use of oral hypoglycemic drugs: Secondary | ICD-10-CM | POA: Diagnosis not present

## 2023-07-27 DIAGNOSIS — E785 Hyperlipidemia, unspecified: Secondary | ICD-10-CM | POA: Insufficient documentation

## 2023-07-27 DIAGNOSIS — E1165 Type 2 diabetes mellitus with hyperglycemia: Secondary | ICD-10-CM | POA: Diagnosis not present

## 2023-07-27 LAB — COMPREHENSIVE METABOLIC PANEL
ALT: 26 U/L (ref 0–53)
AST: 17 U/L (ref 0–37)
Albumin: 4.2 g/dL (ref 3.5–5.2)
Alkaline Phosphatase: 88 U/L (ref 39–117)
BUN: 8 mg/dL (ref 6–23)
CO2: 29 mEq/L (ref 19–32)
Calcium: 9.2 mg/dL (ref 8.4–10.5)
Chloride: 97 mEq/L (ref 96–112)
Creatinine, Ser: 0.7 mg/dL (ref 0.40–1.50)
GFR: 118.32 mL/min (ref >60.00)
Glucose, Bld: 315 mg/dL — ABNORMAL HIGH (ref 70–99)
Potassium: 4 mEq/L (ref 3.5–5.1)
Sodium: 133 mEq/L — ABNORMAL LOW (ref 135–145)
Total Bilirubin: 0.6 mg/dL (ref 0.2–1.2)
Total Protein: 7.5 g/dL (ref 6.0–8.3)

## 2023-07-27 LAB — TSH: TSH: 2.54 u[IU]/mL (ref 0.35–5.50)

## 2023-07-27 LAB — LIPID PANEL
Cholesterol: 233 mg/dL — ABNORMAL HIGH (ref 0–200)
HDL: 41.4 mg/dL (ref >39.00)
LDL Cholesterol: 174 mg/dL — ABNORMAL HIGH (ref 0–99)
NonHDL: 191.25
Total CHOL/HDL Ratio: 6
Triglycerides: 87 mg/dL (ref 0.0–149.0)
VLDL: 17.4 mg/dL (ref 0.0–40.0)

## 2023-07-27 LAB — HEMOGLOBIN A1C: Hgb A1c MFr Bld: 13.4 % — ABNORMAL HIGH (ref 4.6–6.5)

## 2023-07-27 MED ORDER — METFORMIN HCL ER 500 MG PO TB24: 500.0000 mg | ORAL_TABLET | Freq: Two times a day (BID) | ORAL | 3 refills | Status: DC

## 2023-07-27 NOTE — Assessment & Plan Note (Signed)
I will check labs to screen for other metabolic effects of obesity. I will refer him to the [facility] to assist him in management of this.

## 2023-07-27 NOTE — Progress Notes (Signed)
[facility] [address] [place] [address] Dept: [phone] Dept Fax: [phone]  New Patient Office Visit  Subjective:    Patient ID: Mario Livingston, male    DOB: 11-May-1986, 37 y.o..   MRN: [id number]  Chief Complaint  Patient presents with   Establish Care    NP- establish care.   C/o weight gain.     History of Present Illness:  Patient is in today to establish care. Mr. Marine was born in [place], [place]. His father was int he [employer], so they moved around a bit. He lived in [place] for several years in childhood, then [place], before settling back in [place]. He attended college at [employer], majoring in history and secondary education. 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  Mr. Ligocki notes a long-standing struggle with his weight. He has tried various diet and exercise approaches. He had about 60 lbs of weight loss, but then hit a plateau. He has a history fo sleep apnea, but is unsure if his weight is causing him other issues.  Past Medical History: Patient Active Problem List   Diagnosis Date Noted   Morbid obesity with BMI of 50.0-59.9, adult (HCC) 07/27/2023   OSA (obstructive sleep apnea) 08/23/2018   Past Surgical History:  Procedure Laterality Date   ANKLE RECONSTRUCTION  2004   heart ablation     age 81   Family History  Problem Relation Age of Onset   Obesity Mother    Diabetes Father    Cancer Father        AML   Hypertension Father    Hypertension Brother    Heart disease Maternal Uncle        Sudden cardiac death- ? WPW   Stroke Maternal Grandfather    Hypertension Paternal Grandmother    Diabetes Paternal Grandfather    Outpatient Medications Prior to Visit  Medication Sig Dispense Refill   APPLE  CIDER VINEGAR PO Take by mouth.     alum & mag hydroxide-simeth (MAALOX MAX) 400-400-40 MG/5ML suspension Take 10 mLs by mouth every 6 (six) hours as needed for indigestion. 355 mL 0   furosemide (LASIX) 40 MG tablet Take 40 mg by mouth daily.     Nutritional Supplements (ADULT NUTRITIONAL SUPPLEMENT + PO) Take 2 capsules by mouth daily. Ketoxol supplement     pantoprazole (PROTONIX) 40 MG tablet Take 1 tablet (40 mg total) by mouth daily. 30 tablet 0   No facility-administered medications prior to visit.   No Known Allergies Objective:   Today's Vitals   07/27/23 1012  BP: 130/84  Pulse: 94  Temp: 98.2 F (36.8 C)  TempSrc: Temporal  SpO2: 96%  Weight: (!) 361 lb (163.7 kg)  Height: 5\' 9"  (1.753 m)   Body mass index is 53.31 kg/m.   General: Well developed, well nourished. No acute distress. Psych: Alert and oriented. Normal mood and affect.  Health Maintenance Due  Topic Date Due   HIV Screening  Never done   Hepatitis C Screening  Never done   DTaP/Tdap/Td (1 - Tdap) Never done     Assessment & Plan:   Problem List Items Addressed This Visit       Other   Morbid obesity with BMI of 50.0-59.9, adult (HCC) - Primary  Relevant Orders   Comprehensive metabolic panel   Lipid panel   TSH   Hemoglobin A1c   Amb Ref to Medical Weight Management   Other Visit Diagnoses     Need for Tdap vaccination       Relevant Orders   Tdap vaccine greater than or equal to 7yo IM       Return in about 1 year (around 07/26/2024) for Annual preventative care.   Loyola Mast, MD

## 2023-07-27 NOTE — Addendum Note (Signed)
Addended by: Loyola Mast on: 07/27/2023 03:48 PM   Modules accepted: Orders

## 2023-07-28 ENCOUNTER — Encounter (INDEPENDENT_AMBULATORY_CARE_PROVIDER_SITE_OTHER): Payer: Self-pay

## 2023-08-10 ENCOUNTER — Ambulatory Visit (INDEPENDENT_AMBULATORY_CARE_PROVIDER_SITE_OTHER): Payer: PRIVATE HEALTH INSURANCE | Admitting: Family Medicine

## 2023-08-10 ENCOUNTER — Encounter: Payer: Self-pay | Admitting: Family Medicine

## 2023-08-10 VITALS — BP 134/82 | HR 86 | Temp 98.8°F | Ht 69.0 in | Wt 359.0 lb

## 2023-08-10 DIAGNOSIS — R600 Localized edema: Secondary | ICD-10-CM | POA: Diagnosis not present

## 2023-08-10 DIAGNOSIS — I10 Essential (primary) hypertension: Secondary | ICD-10-CM | POA: Diagnosis not present

## 2023-08-10 DIAGNOSIS — E782 Mixed hyperlipidemia: Secondary | ICD-10-CM | POA: Diagnosis not present

## 2023-08-10 DIAGNOSIS — E1165 Type 2 diabetes mellitus with hyperglycemia: Secondary | ICD-10-CM

## 2023-08-10 DIAGNOSIS — Z1159 Encounter for screening for other viral diseases: Secondary | ICD-10-CM

## 2023-08-10 LAB — BASIC METABOLIC PANEL
BUN: 13 mg/dL (ref 6–23)
CO2: 29 mEq/L (ref 19–32)
Calcium: 9.4 mg/dL (ref 8.4–10.5)
Chloride: 96 mEq/L (ref 96–112)
Creatinine, Ser: 0.65 mg/dL (ref 0.40–1.50)
GFR: 120.97 mL/min (ref >60.00)
Glucose, Bld: 205 mg/dL — ABNORMAL HIGH (ref 70–99)
Potassium: 3.8 mEq/L (ref 3.5–5.1)
Sodium: 131 mEq/L — ABNORMAL LOW (ref 135–145)

## 2023-08-10 LAB — MICROALBUMIN / CREATININE URINE RATIO
Creatinine,U: 202.4 mg/dL
Microalb Creat Ratio: 10.5 mg/g (ref 0.0–30.0)
Microalb, Ur: 21.3 mg/dL — ABNORMAL HIGH (ref 0.0–1.9)

## 2023-08-10 MED ORDER — LISINOPRIL 5 MG PO TABS: 5.0000 mg | ORAL_TABLET | Freq: Every day | ORAL | 3 refills | Status: DC

## 2023-08-10 MED ORDER — ATORVASTATIN CALCIUM 20 MG PO TABS: 20.0000 mg | ORAL_TABLET | Freq: Every day | ORAL | 3 refills | Status: DC

## 2023-08-10 MED ORDER — FUROSEMIDE 20 MG PO TABS: 20.0000 mg | ORAL_TABLET | Freq: Every day | ORAL | 3 refills | Status: AC | PRN

## 2023-08-10 NOTE — Assessment & Plan Note (Signed)
This has been a chronic issue associated with his obesity. I will treat him with periodic Lasix to control this.

## 2023-08-10 NOTE — Assessment & Plan Note (Signed)
Discussed increased CV risk associated with diabetes and elevated lipids. I will start him on atorvastatin 20 mg daily.

## 2023-08-10 NOTE — Assessment & Plan Note (Signed)
Mr. Vorwald blood pressures are consistent with Stage 1 hypertension. I recommend we start him on lisinopril 5 mg for both blood pressure and renal protective effects.

## 2023-08-10 NOTE — Assessment & Plan Note (Signed)
We discussed the diagnosis of Type 2 diabetes, its pathogenesis, complications, and treatment. I will continue metformin 500 mg bid. I will refer him for diabetes teaching. I will screen for renal complications. I urged him to schedule an eye appointment. I will plan to follow-up with him in 2 months for a repeat A1c.

## 2023-08-10 NOTE — Progress Notes (Signed)
[facility] [address] [place] [address] Dept: [phone] Dept Fax: [phone]  Chronic Care Office Visit  Subjective:    Patient ID: Mario Livingston, male    DOB: 02-16-1986, 37 y.o..   MRN: [id number]  Chief Complaint  Patient presents with   Diabetes    F/u labs.     History of Present Illness:  Patient is in today for reassessment of chronic medical issues.  Lab testing from Mario Livingston first visit showed he has developed Type 2 diabetes. I have since initiated him on metformin 500 mg bid. He notes he had some GI effects initially that are improved at this point. He has been tryign to drink more water, improve his diet, and be more active.  Past Medical History: Patient Active Problem List   Diagnosis Date Noted   Essential hypertension 08/10/2023   Pedal edema 08/10/2023   Morbid obesity with BMI of 50.0-59.9, adult (HCC) 07/27/2023   Type 2 diabetes mellitus with hyperglycemia, without long-term current use of insulin (HCC) 07/27/2023   Hyperlipidemia 07/27/2023   OSA (obstructive sleep apnea) 08/23/2018   Past Surgical History:  Procedure Laterality Date   ANKLE RECONSTRUCTION  2004   heart ablation     age 35   Family History  Problem Relation Age of Onset   Obesity Mother    Diabetes Father    Cancer Father        AML   Hypertension Father    Hypertension Brother    Heart disease Maternal Uncle        Sudden cardiac death- ? WPW   Stroke Maternal Grandfather    Hypertension Paternal Grandmother    Diabetes Paternal Grandfather    Outpatient Medications Prior to Visit  Medication Sig Dispense Refill   APPLE CIDER VINEGAR PO Take by mouth.     metFORMIN (GLUCOPHAGE-XR) 500 MG 24 hr tablet Take 1 tablet (500 mg total) by mouth 2 (two) times daily with a meal. 180 tablet 3   No facility-administered medications prior to visit.   No Known Allergies Objective:   Today's Vitals    08/10/23 0913  BP: 134/82  Pulse: 86  Temp: 98.8 F (37.1 C)  TempSrc: Temporal  SpO2: 97%  Weight: (!) 359 lb (162.8 kg)  Height: 5\' 9"  (1.753 m)   Body mass index is 53.02 kg/m.   General: Well developed, well nourished. No acute distress. Extremities: 2+ lower leg/ankle edema. Feet- Skin intact. No sign of maceration between toes. Nails are normal. Dorsalis pedis and posterior tibial artery   pulses are normal. 5.07 monofilament testing normal. Psych: Alert and oriented. Normal mood and affect.  Health Maintenance Due  Topic Date Due   OPHTHALMOLOGY EXAM  Never done   HIV Screening  Never done   Diabetic kidney evaluation - Urine ACR  Never done   Hepatitis C Screening  Never done   Lab Results    Latest Ref Rng & Units 07/27/2023   11:01 AM 07/05/2020    5:29 PM 01/03/2018    2:38 PM  CMP  Glucose 70 - 99 mg/dL 387  564  332   BUN 6 - 23 mg/dL 8  12  11    Creatinine 0.40 - 1.50 mg/dL 9.51  8.84  1.66   Sodium 135 - 145 mEq/L 133  136  137   Potassium 3.5 - 5.1 mEq/L 4.0  4.1  3.7   Chloride 96 - 112 mEq/L 97  96  99   CO2 19 - 32 mEq/L 29  28  30    Calcium 8.4 - 10.5 mg/dL 9.2  9.1  9.4   Total Protein 6.0 - 8.3 g/dL 7.5   7.7   Total Bilirubin 0.2 - 1.2 mg/dL 0.6   0.5   Alkaline Phos 39 - 117 U/L 88   67   AST 0 - 37 U/L 17   19   ALT 0 - 53 U/L 26   27    Last lipids Lab Results  Component Value Date   CHOL 233 (H) 07/27/2023   HDL 41.40 07/27/2023   LDLCALC 174 (H) 07/27/2023   TRIG 87.0 07/27/2023   CHOLHDL 6 07/27/2023   Last hemoglobin A1c Lab Results  Component Value Date   HGBA1C 13.4 (H) 07/27/2023   Last thyroid functions Lab Results  Component Value Date   TSH 2.54 07/27/2023     Assessment & Plan:   Problem List Items Addressed This Visit       Cardiovascular and Mediastinum   Essential hypertension    Mario Livingston blood pressures are consistent with Stage 1 hypertension. I recommend we start him on lisinopril 5 mg for both blood  pressure and renal protective effects.      Relevant Medications   atorvastatin (LIPITOR) 20 MG tablet   lisinopril (ZESTRIL) 5 MG tablet   furosemide (LASIX) 20 MG tablet     Endocrine   Type 2 diabetes mellitus with hyperglycemia, without long-term current use of insulin (HCC) - Primary    We discussed the diagnosis of Type 2 diabetes, its pathogenesis, complications, and treatment. I will continue metformin 500 mg bid. I will refer him for diabetes teaching. I will screen for renal complications. I urged him to schedule an eye appointment. I will plan to follow-up with him in 2 months for a repeat A1c.      Relevant Medications   atorvastatin (LIPITOR) 20 MG tablet   lisinopril (ZESTRIL) 5 MG tablet   Other Relevant Orders   Microalbumin / creatinine urine ratio   Basic metabolic panel   Amb Referral to Nutrition and Diabetic Education     Other   Hyperlipidemia    Discussed increased CV risk associated with diabetes and elevated lipids. I will start him on atorvastatin 20 mg daily.      Relevant Medications   atorvastatin (LIPITOR) 20 MG tablet   lisinopril (ZESTRIL) 5 MG tablet   furosemide (LASIX) 20 MG tablet   Pedal edema    This has been a chronic issue associated with his obesity. I will treat him with periodic Lasix to control this.      Relevant Medications   furosemide (LASIX) 20 MG tablet   Other Visit Diagnoses     Encounter for hepatitis C screening test for low risk patient       Relevant Orders   HCV Ab w Reflex to Quant PCR       Return in about 2 months (around 10/10/2023) for Reassessment.   Loyola Mast, MD

## 2023-08-12 LAB — HCV INTERPRETATION

## 2023-09-21 ENCOUNTER — Encounter: Payer: PRIVATE HEALTH INSURANCE | Attending: Family Medicine | Admitting: Registered"

## 2023-09-21 ENCOUNTER — Encounter: Payer: Self-pay | Admitting: Registered"

## 2023-09-21 DIAGNOSIS — Z713 Dietary counseling and surveillance: Secondary | ICD-10-CM | POA: Diagnosis not present

## 2023-09-21 DIAGNOSIS — E1165 Type 2 diabetes mellitus with hyperglycemia: Secondary | ICD-10-CM | POA: Insufficient documentation

## 2023-09-21 DIAGNOSIS — E119 Type 2 diabetes mellitus without complications: Secondary | ICD-10-CM

## 2023-09-21 NOTE — Progress Notes (Signed)
Diabetes Self-Management Education  Visit Type: First/Initial  Appt. Start Time: 10:30 Appt. End Time: 11:05  09/21/2023  Mr. Mario Livingston, identified by name and date of birth, is a 37 y.o. male with a diagnosis of Diabetes: Type 2.   ASSESSMENT  There were no vitals taken for this visit. There is no height or weight on file to calculate BMI.  Pt states he has next appt with medical provider on 10/14 for medication follow-up.   States he watched his father battle diabetes and leukemia. States mom is Engineer, civil (consulting) and lives locally. States he has one daughter, 72 years old and separated from wife.   Reports he works 2 jobs and states eating schedule is infrequent which is his main concern. Reports work schedule: Wednesday - Saturday 5:30 - 4 or 5:30 pm   Tuesday - Friday 6 -10 or 11 pm (work at [employer])  States he sleeps 11:30 pm - 4:30 am; 5 hours of sleep/night.  Reports his free days are: Sunday, Monday, and Tuesday.   States she eats mostly chicken, salmon, or salad bar (at work). States he has been drinking flavor packs with water.   States he is going to start going back to the gym, [employer].  States he is worried about his daughter often when she is not with him. Reports increased stress related to separation and co-parenting.    Diabetes Self-Management Education - 09/21/23 1034       Visit Information   Visit Type First/Initial      Initial Visit   Diabetes Type Type 2    Date Diagnosed 06/2023    Are you currently following a meal plan? No    Are you taking your medications as prescribed? Yes      Health Coping   How would you rate your overall health? Fair      Psychosocial Assessment   Patient Belief/Attitude about Diabetes Motivated to manage diabetes   initially felt defeated and now motivated   What is the hardest part about your diabetes right now, causing you the most concern, or is the most worrisome to you about your diabetes?   Making healty food  and beverage choices    Self-care barriers None    Self-management support Family    Other persons present Patient    Patient Concerns Nutrition/Meal planning    Special Needs None    Preferred Learning Style No preference indicated    Learning Readiness Ready    How often do you need to have someone help you when you read instructions, pamphlets, or other written materials from your doctor or pharmacy? 1 - Never    What is the last grade level you completed in school? BS degree      Complications   Last HgB A1C per patient/outside source 13.4 %    How often do you check your blood sugar? 0 times/day (not testing)    Number of hypoglycemic episodes per month 0    Number of hyperglycemic episodes ( >200mg /dL): Occasional    Can you tell when your blood sugar is high? Yes    What do you do if your blood sugar is high? drinks water    Have you had a dilated eye exam in the past 12 months? No    Have you had a dental exam in the past 12 months? No    Are you checking your feet? Yes    How many days per week are you checking your feet?  7      Dietary Intake   Breakfast egg + 1 slice of toast + 3 slices of [place] bacon or skips    Lunch [employer] - 6" [place] sub on wheat bread (veggies, garlic aoli sauce) + zero-sugar lemonade    Snack (afternoon) sometimes chips    Dinner grilled onions + ground beef    Beverage(s) zero-sugar lemonade (21 oz), water with flavor packet (5*16 oz; 80 oz)      Activity / Exercise   Activity / Exercise Type ADL's    How many days per week do you exercise? 0    How many minutes per day do you exercise? 0    Total minutes per week of exercise 0      Patient Education   Previous Diabetes Education No    Diabetes Stress and Support Role of stress on diabetes;Brainstormed with patient on coping mechanisms for social situations, getting support from significant others, dealing with feelings about diabetes;Other (comment)   Encouraged therapy provided by job for  relationship-related stress     Individualized Goals (developed by patient)   Medications take my medication as prescribed    Monitoring  Test my blood glucose as discussed      Post-Education Assessment   Patient understands the diabetes disease and treatment process. Needs Instruction    Patient understands incorporating nutritional management into lifestyle. Needs Instruction    Patient undertands incorporating physical activity into lifestyle. Needs Instruction    Patient understands using medications safely. Needs Review    Patient understands monitoring blood glucose, interpreting and using results Needs Instruction    Patient understands prevention, detection, and treatment of acute complications. Needs Instruction    Patient understands prevention, detection, and treatment of chronic complications. Needs Instruction    Patient understands how to develop strategies to address psychosocial issues. Comprehends key points    Patient understands how to develop strategies to promote health/change behavior. Needs Instruction      Outcomes   Expected Outcomes Demonstrated interest in learning but significant barriers to change    Future DMSE 4-6 wks    Program Status Not Completed             Individualized Plan for Diabetes Self-Management Training:   Learning Objective:  Patient will have a greater understanding of diabetes self-management. Patient education plan is to attend individual and/or group sessions per assessed needs and concerns.   Plan:   There are no Patient Instructions on file for this visit.  Expected Outcomes:  Demonstrated interest in learning but significant barriers to change  Education material provided: ADA - How to Thrive: A Guide for Your Journey with Diabetes  If problems or questions, patient to contact team via:  Phone and Email  Future DSME appointment: 4-6 wks

## 2023-10-11 ENCOUNTER — Encounter: Payer: Self-pay | Admitting: Family Medicine

## 2023-10-11 ENCOUNTER — Ambulatory Visit (INDEPENDENT_AMBULATORY_CARE_PROVIDER_SITE_OTHER): Payer: PRIVATE HEALTH INSURANCE | Admitting: Family Medicine

## 2023-10-11 VITALS — BP 130/82 | HR 81 | Temp 97.8°F | Ht 69.0 in | Wt 358.4 lb

## 2023-10-11 DIAGNOSIS — E1165 Type 2 diabetes mellitus with hyperglycemia: Secondary | ICD-10-CM | POA: Diagnosis not present

## 2023-10-11 DIAGNOSIS — I10 Essential (primary) hypertension: Secondary | ICD-10-CM | POA: Diagnosis not present

## 2023-10-11 DIAGNOSIS — Z7984 Long term (current) use of oral hypoglycemic drugs: Secondary | ICD-10-CM

## 2023-10-11 DIAGNOSIS — E782 Mixed hyperlipidemia: Secondary | ICD-10-CM

## 2023-10-11 LAB — GLUCOSE, RANDOM: Glucose, Bld: 218 mg/dL — ABNORMAL HIGH (ref 70–99)

## 2023-10-11 LAB — HEMOGLOBIN A1C: Hgb A1c MFr Bld: 10.2 % — ABNORMAL HIGH (ref 4.6–6.5)

## 2023-10-11 MED ORDER — LANCET DEVICE MISC: 1.0000 | Freq: Three times a day (TID) | 0 refills | Status: AC

## 2023-10-11 MED ORDER — LANCETS MISC. MISC: 1.0000 | Freq: Three times a day (TID) | 0 refills | Status: AC

## 2023-10-11 MED ORDER — BLOOD GLUCOSE TEST VI STRP: 1.0000 | ORAL_STRIP | Freq: Every day | 2 refills | Status: AC

## 2023-10-11 MED ORDER — BLOOD GLUCOSE MONITORING SUPPL DEVI: 1.0000 | Freq: Three times a day (TID) | 0 refills | Status: AC

## 2023-10-11 MED ORDER — LISINOPRIL 10 MG PO TABS: 10.0000 mg | ORAL_TABLET | Freq: Every day | ORAL | 3 refills | Status: AC

## 2023-10-11 NOTE — Assessment & Plan Note (Signed)
Mr. Haack blood pressure is still above goal. I recommend we increase his lisinopril to 10 mg daily.

## 2023-10-11 NOTE — Assessment & Plan Note (Signed)
Continue atorvastatin 20mg  daily

## 2023-10-11 NOTE — Progress Notes (Signed)
[facility] PRIMARY CARE [facility] PRIMARY [facility] [address] [place] [address] Dept: [phone] Dept Fax: [phone]  Chronic Care Office Visit  Subjective:    Patient ID: Mario Livingston, male    DOB: [birth year], 37 y.o..   MRN: [id number]  Chief Complaint  Patient presents with   Hypertension    2 month f/u.   No concerns.    History of Present Illness:  Patient is in today for reassessment of chronic medical issues.  [name] has Type 2 diabetes, diagnose this summer. He is prescribed 500 mg bid, but notes he does miss doses on a regular basis. He did go for diabetes teaching. He notes he was asked ot start monitoring his blood sugar daily, but has not obtain a HGM.  [name] has hypertension. He is managed on lisinopril 5 mg daily.  [name] has hyperlipidemia. He is managed on atorvastatin 20 mg daily.  Past Medical History: Patient Active Problem List   Diagnosis Date Noted   Essential hypertension 08/10/2023   Pedal edema 08/10/2023   Morbid obesity with BMI of 50.0-59.9, adult (HCC) 07/27/2023   Type 2 diabetes mellitus with hyperglycemia, without long-term current use of insulin (HCC) 07/27/2023   Hyperlipidemia 07/27/2023   OSA (obstructive sleep apnea) 08/23/2018   Past Surgical History:  Procedure Laterality Date   ANKLE RECONSTRUCTION  2004   heart ablation     age 48   Family History  Problem Relation Age of Onset   Obesity Mother    Diabetes Father    Cancer Father        AML   Hypertension Father    Hypertension Brother    Heart disease Maternal Uncle        Sudden cardiac death- ? WPW   Stroke Maternal Grandfather    Hypertension Paternal Grandmother    Diabetes Paternal Grandfather    Outpatient Medications Prior to Visit  Medication Sig Dispense Refill   APPLE CIDER VINEGAR PO Take by mouth.     atorvastatin (LIPITOR) 20 MG tablet Take 1 tablet (20 mg total) by mouth daily. 90 tablet 3   furosemide  (LASIX) 20 MG tablet Take 1 tablet (20 mg total) by mouth daily as needed. 30 tablet 3   metFORMIN (GLUCOPHAGE-XR) 500 MG 24 hr tablet Take 1 tablet (500 mg total) by mouth 2 (two) times daily with a meal. 180 tablet 3   lisinopril (ZESTRIL) 5 MG tablet Take 1 tablet (5 mg total) by mouth daily. 90 tablet 3   No facility-administered medications prior to visit.   No Known Allergies Objective:   Today's Vitals   10/11/23 0910  BP: 130/82  Pulse: 81  Temp: 97.8 F (36.6 C)  TempSrc: Temporal  SpO2: 96%  Weight: (!) 358 lb 6.4 oz (162.6 kg)  Height: 5\' 9"  (1.753 m)   Body mass index is 52.93 kg/m.   General: Well developed, well nourished. No acute distress. Psych: Alert and oriented. Normal mood and affect.  Health Maintenance Due  Topic Date Due   OPHTHALMOLOGY EXAM  Never done   HIV Screening  Never done     Assessment & Plan:  Essential hypertension Assessment & Plan: [name] blood pressure is still above goal. I recommend we increase his lisinopril to 10 mg daily.  Orders: -     Lisinopril; Take 1 tablet (10 mg total) by mouth daily.  Dispense: 90 tablet; Refill: 3  Type 2 diabetes mellitus with hyperglycemia, without long-term  current use of insulin (HCC) Assessment & Plan: I will reassess his A1c today. Continue metformin 500 mg bid. Discussed approaches to improve his medication adherence.  Orders: -     Glucose, random -     Hemoglobin A1c -     Blood Glucose Monitoring Suppl; 1 each by Does not apply route in the morning, at noon, and at bedtime. May substitute to any manufacturer covered by patient's insurance.  Dispense: 1 each; Refill: 0 -     Blood Glucose Test; 1 each by In Vitro route daily. May substitute to any manufacturer covered by patient's insurance.  Dispense: 100 strip; Refill: 2 -     Lancet Device; 1 each by Does not apply route in the morning, at noon, and at bedtime. May substitute to any manufacturer covered by patient's insurance.   Dispense: 1 each; Refill: 0 -     Lancets Misc.; 1 each by Does not apply route in the morning, at noon, and at bedtime. May substitute to any manufacturer covered by patient's insurance.  Dispense: 100 each; Refill: 0  Mixed hyperlipidemia Assessment & Plan: Continue atorvastatin 20 mg daily.    Return in about 3 months (around 01/11/2024) for Reassessment.   Loyola Mast, MD

## 2023-10-11 NOTE — Assessment & Plan Note (Signed)
I will reassess his A1c today. Continue metformin 500 mg bid. Discussed approaches to improve his medication adherence.

## 2023-11-02 ENCOUNTER — Ambulatory Visit: Payer: PRIVATE HEALTH INSURANCE | Admitting: Registered"

## 2023-12-28 ENCOUNTER — Ambulatory Visit: Payer: PRIVATE HEALTH INSURANCE | Admitting: Registered"

## 2024-01-11 ENCOUNTER — Ambulatory Visit: Payer: PRIVATE HEALTH INSURANCE | Admitting: Family Medicine

## 2024-01-31 ENCOUNTER — Ambulatory Visit: Payer: PRIVATE HEALTH INSURANCE | Admitting: Family Medicine

## 2024-02-08 ENCOUNTER — Ambulatory Visit: Payer: PRIVATE HEALTH INSURANCE | Admitting: Family Medicine

## 2024-02-21 ENCOUNTER — Ambulatory Visit: Payer: PRIVATE HEALTH INSURANCE | Admitting: Family Medicine

## 2024-02-22 ENCOUNTER — Encounter: Payer: Self-pay | Admitting: Family Medicine

## 2024-02-22 ENCOUNTER — Ambulatory Visit (INDEPENDENT_AMBULATORY_CARE_PROVIDER_SITE_OTHER): Payer: PRIVATE HEALTH INSURANCE | Admitting: Family Medicine

## 2024-02-22 VITALS — BP 134/82 | HR 83 | Temp 97.9°F | Resp 18 | Wt 359.2 lb

## 2024-02-22 DIAGNOSIS — I10 Essential (primary) hypertension: Secondary | ICD-10-CM

## 2024-02-22 DIAGNOSIS — E782 Mixed hyperlipidemia: Secondary | ICD-10-CM | POA: Diagnosis not present

## 2024-02-22 DIAGNOSIS — Z7984 Long term (current) use of oral hypoglycemic drugs: Secondary | ICD-10-CM

## 2024-02-22 DIAGNOSIS — E1165 Type 2 diabetes mellitus with hyperglycemia: Secondary | ICD-10-CM | POA: Diagnosis not present

## 2024-02-22 DIAGNOSIS — Z23 Encounter for immunization: Secondary | ICD-10-CM

## 2024-02-22 LAB — GLUCOSE, RANDOM: Glucose, Bld: 212 mg/dL — ABNORMAL HIGH (ref 70–99)

## 2024-02-22 LAB — HEMOGLOBIN A1C: Hgb A1c MFr Bld: 11 % — ABNORMAL HIGH (ref 4.6–6.5)

## 2024-02-22 MED ORDER — OZEMPIC (0.25 OR 0.5 MG/DOSE) 2 MG/3ML ~~LOC~~ SOPN: PEN_INJECTOR | SUBCUTANEOUS | 2 refills | Status: AC

## 2024-02-22 MED ORDER — ATORVASTATIN CALCIUM 20 MG PO TABS: 20.0000 mg | ORAL_TABLET | Freq: Every day | ORAL | 3 refills | Status: AC

## 2024-02-22 MED ORDER — METFORMIN HCL ER 500 MG PO TB24: 500.0000 mg | ORAL_TABLET | Freq: Two times a day (BID) | ORAL | 3 refills | Status: AC

## 2024-02-22 NOTE — Addendum Note (Signed)
 Addended by: Loyola Mast on: 02/22/2024 02:05 PM   Modules accepted: Orders

## 2024-02-22 NOTE — Assessment & Plan Note (Signed)
 Continue atorvastatin 20mg  daily

## 2024-02-22 NOTE — Progress Notes (Signed)
 [facility] PRIMARY CARE [facility] PRIMARY [facility] [address] [place] [address] Dept: [phone] Dept Fax: [phone]  Chronic Care Office Visit  Subjective:    Patient ID: Mario Livingston, male    DOB: [birth year], 38 y.o..   MRN: [id number]  Chief Complaint  Patient presents with   Medical Managment Of Chronic Issues    3 month follow up; eye exam and Prevnar 20 vaccine are due    History of Present Illness:  Patient is in today for reassessment of chronic medical issues.  Mr. Kuhl has Type 2 diabetes, diagnose last summer. He is prescribed metformin 500 mg bid, but notes he has found adherence to be hard. He feel she does take this at least once a day.  He has been going to the gyn for cardio several days a week. he also has made changes in his diet at home to reduce snacking.   Mr. Waddill has hypertension. He is managed on lisinopril 10 mg daily.   Mr. Buth has hyperlipidemia. He is managed on atorvastatin 20 mg daily.  Past Medical History: Patient Active Problem List   Diagnosis Date Noted   Essential hypertension 08/10/2023   Pedal edema 08/10/2023   Morbid obesity with BMI of 50.0-59.9, adult (HCC) 07/27/2023   Type 2 diabetes mellitus with hyperglycemia, without long-term current use of insulin (HCC) 07/27/2023   Hyperlipidemia 07/27/2023   OSA (obstructive sleep apnea) 08/23/2018   Past Surgical History:  Procedure Laterality Date   ANKLE RECONSTRUCTION  2004   heart ablation     age 57   Family History  Problem Relation Age of Onset   Obesity Mother    Diabetes Father    Cancer Father        AML   Hypertension Father    Hypertension Brother    Heart disease Maternal Uncle        Sudden cardiac death- ? WPW   Stroke Maternal Grandfather    Hypertension Paternal Grandmother    Diabetes Paternal Grandfather    Outpatient Medications Prior to Visit  Medication Sig Dispense Refill   Accu-Chek Softclix Lancets lancets  1 each 3 (three) times daily.     APPLE CIDER VINEGAR PO Take by mouth.     Blood Glucose Monitoring Suppl DEVI 1 each by Does not apply route in the morning, at noon, and at bedtime. May substitute to any manufacturer covered by patient's insurance. 1 each 0   furosemide (LASIX) 20 MG tablet Take 1 tablet (20 mg total) by mouth daily as needed. 30 tablet 3   Glucose Blood (BLOOD GLUCOSE TEST STRIPS) STRP 1 each by In Vitro route daily. May substitute to any manufacturer covered by patient's insurance. 100 strip 2   Lancets Misc. (ACCU-CHEK SOFTCLIX LANCET DEV) KIT      lisinopril (ZESTRIL) 10 MG tablet Take 1 tablet (10 mg total) by mouth daily. 90 tablet 3   atorvastatin (LIPITOR) 20 MG tablet Take 1 tablet (20 mg total) by mouth daily. 90 tablet 3   metFORMIN (GLUCOPHAGE-XR) 500 MG 24 hr tablet Take 1 tablet (500 mg total) by mouth 2 (two) times daily with a meal. 180 tablet 3   No facility-administered medications prior to visit.   No Known Allergies Objective:   Today's Vitals   02/22/24 1045  BP: 134/82  Pulse: 83  Resp: 18  Temp: 97.9 F (36.6 C)  TempSrc: Temporal  SpO2: 96%  Weight: (!) 359 lb 3.2 oz (162.9 kg)  PainSc: 0-No pain   Body mass index is 53.04 kg/m.   General: Well developed, well nourished. No acute distress. Psych: Alert and oriented. Normal mood and affect.  Health Maintenance Due  Topic Date Due   Pneumococcal Vaccine 101-31 Years old (1 of 2 - PCV) Never done   OPHTHALMOLOGY EXAM  Never done   HIV Screening  Never done     Assessment & Plan:   Problem List Items Addressed This Visit       Cardiovascular and Mediastinum   Essential hypertension - Primary   Mr. Sellitto blood pressure is improving and acceptable. Continue lisinopril 10 mg daily.      Relevant Medications   atorvastatin (LIPITOR) 20 MG tablet     Endocrine   Type 2 diabetes mellitus with hyperglycemia, without long-term current use of insulin (HCC)   I will reassess his  A1c today. Continue metformin 500 mg bid. If the A1c remains above goal, I would see about starting Ozempic. Mr. Brandow would benefit from weight loss. He is already engaged in weight management efforts, including a foundation of a healthy, calorie-controlled diet and regular exercise.      Relevant Medications   atorvastatin (LIPITOR) 20 MG tablet   metFORMIN (GLUCOPHAGE-XR) 500 MG 24 hr tablet   Other Relevant Orders   Glucose, random   Hemoglobin A1c     Other   Hyperlipidemia   Continue atorvastatin 20 mg daily.      Relevant Medications   atorvastatin (LIPITOR) 20 MG tablet   Other Visit Diagnoses       Need for pneumococcal 20-valent conjugate vaccination       Relevant Orders   Pneumococcal conjugate vaccine 20-valent (Completed)       Return in about 3 months (around 05/21/2024) for Reassessment.   Loyola Mast, MD

## 2024-02-22 NOTE — Assessment & Plan Note (Signed)
 Mr. Mario Livingston blood pressure is improving and acceptable. Continue lisinopril 10 mg daily.

## 2024-02-22 NOTE — Assessment & Plan Note (Signed)
 I will reassess his A1c today. Continue metformin 500 mg bid. If the A1c remains above goal, I would see about starting Ozempic. Mario Livingston would benefit from weight loss. He is already engaged in weight management efforts, including a foundation of a healthy, calorie-controlled diet and regular exercise.

## 2024-03-09 ENCOUNTER — Encounter: Payer: Self-pay | Admitting: Family Medicine

## 2024-03-09 DIAGNOSIS — N181 Chronic kidney disease, stage 1: Secondary | ICD-10-CM | POA: Insufficient documentation

## 2024-03-23 ENCOUNTER — Telehealth: Payer: PRIVATE HEALTH INSURANCE | Admitting: Nurse Practitioner

## 2024-03-23 DIAGNOSIS — J020 Streptococcal pharyngitis: Secondary | ICD-10-CM

## 2024-03-23 MED ORDER — AMOXICILLIN 500 MG PO CAPS: 500.0000 mg | ORAL_CAPSULE | Freq: Two times a day (BID) | ORAL | 0 refills | Status: AC

## 2024-03-23 NOTE — Progress Notes (Signed)
 Virtual Visit Consent   Mario Livingston, you are scheduled for a virtual visit with a [facility] provider today. Just as with appointments in the office, your consent must be obtained to participate. Your consent will be active for this visit and any virtual visit you may have with one of our providers in the next 365 days. If you have a MyChart account, a copy of this consent can be sent to you electronically.  As this is a virtual visit, video technology does not allow for your provider to perform a traditional examination. This may limit your provider's ability to fully assess your condition. If your provider identifies any concerns that need to be evaluated in person or the need to arrange testing (such as labs, EKG, etc.), we will make arrangements to do so. Although advances in technology are sophisticated, we cannot ensure that it will always work on either your end or our end. If the connection with a video visit is poor, the visit may have to be switched to a telephone visit. With either a video or telephone visit, we are not always able to ensure that we have a secure connection.  By engaging in this virtual visit, you consent to the provision of healthcare and authorize for your insurance to be billed (if applicable) for the services provided during this visit. Depending on your insurance coverage, you may receive a charge related to this service.  I need to obtain your verbal consent now. Are you willing to proceed with your visit today? Clements Toro has provided verbal consent on 03/23/2024 for a virtual visit (video or telephone). Viviano Simas, FNP  Date: 03/23/2024 5:41 PM   Virtual Visit via Video Note   I, Viviano Simas, connected with  Powell Halbert  ([id number], 12/18/1986) on 03/23/24 at  6:45 PM EDT by a video-enabled telemedicine application and verified that I am speaking with the correct person using two identifiers.  Location: Patient: Virtual Visit Location  Patient: Home Provider: Virtual Visit Location Provider: Home Office   I discussed the limitations of evaluation and management by telemedicine and the availability of in person appointments. The patient expressed understanding and agreed to proceed.    History of Present Illness: Mario Livingston is a 38 y.o. 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 and was able to get tested for strep today   He was told that his antibiotic was called in today but he was unable to get the script the pharmacy did not have record  In care everywhere it is evident amoxicillin was called into an outside pharmacy     Problems:  Patient Active Problem List   Diagnosis Date Noted   Chronic kidney disease (CKD) stage G1/A2, glomerular filtration rate (GFR) equal to or greater than 90 mL/min/1.73 square meter and albuminuria creatinine ratio between 30-299 mg/g 03/09/2024   Essential hypertension 08/10/2023   Pedal edema 08/10/2023   Morbid obesity with BMI of 50.0-59.9, adult (HCC) 07/27/2023   Type 2 diabetes mellitus with hyperglycemia, without long-term current use of insulin (HCC) 07/27/2023   Hyperlipidemia 07/27/2023   OSA (obstructive sleep apnea) 08/23/2018    Allergies: No Known Allergies Medications:  Current Outpatient Medications:    Accu-Chek Softclix Lancets lancets, 1 each 3 (three) times daily., Disp: , Rfl:  APPLE CIDER VINEGAR PO, Take by mouth., Disp: , Rfl:    atorvastatin (LIPITOR) 20 MG tablet, Take 1 tablet (20 mg total) by mouth daily., Disp: 90 tablet, Rfl: 3   Blood Glucose Monitoring Suppl DEVI, 1 each by Does not apply route in the morning, at noon, and at bedtime. May substitute to any manufacturer covered by patient's insurance., Disp: 1 each, Rfl: 0   furosemide (LASIX) 20 MG tablet, Take 1 tablet (20 mg total) by mouth  daily as needed., Disp: 30 tablet, Rfl: 3   Glucose Blood (BLOOD GLUCOSE TEST STRIPS) STRP, 1 each by In Vitro route daily. May substitute to any manufacturer covered by patient's insurance., Disp: 100 strip, Rfl: 2   Lancets Misc. (ACCU-CHEK SOFTCLIX LANCET DEV) KIT, , Disp: , Rfl:    lisinopril (ZESTRIL) 10 MG tablet, Take 1 tablet (10 mg total) by mouth daily., Disp: 90 tablet, Rfl: 3   metFORMIN (GLUCOPHAGE-XR) 500 MG 24 hr tablet, Take 1 tablet (500 mg total) by mouth 2 (two) times daily with a meal., Disp: 180 tablet, Rfl: 3   Semaglutide,0.25 or 0.5MG /DOS, (OZEMPIC, 0.25 OR 0.5 MG/DOSE,) 2 MG/3ML SOPN, Inject 0.25 mg into the skin once a week for 28 days, THEN 0.5 mg once a week., Disp: 3 mL, Rfl: 2  Observations/Objective: Patient is well-developed, well-nourished in no acute distress.  Resting comfortably  at home.  Head is normocephalic, atraumatic.  No labored breathing.  Speech is clear and coherent with logical content.  Patient is alert and oriented at baseline.    Assessment and Plan:  1. Strep throat Meds ordered this encounter  Medications   amoxicillin (AMOXIL) 500 MG capsule    Sig: Take 1 capsule (500 mg total) by mouth 2 (two) times daily for 10 days.    Dispense:  20 capsule    Refill:  0     Follow Up Instructions: I discussed the assessment and treatment plan with the patient. The patient was provided an opportunity to ask questions and all were answered. The patient agreed with the plan and demonstrated an understanding of the instructions.  A copy of instructions were sent to the patient via MyChart unless otherwise noted below.    The patient was advised to call back or seek an in-person evaluation if the symptoms worsen or if the condition fails to improve as anticipated.    Viviano Simas, FNP

## 2024-05-23 ENCOUNTER — Ambulatory Visit: Payer: PRIVATE HEALTH INSURANCE | Admitting: Family Medicine

## 2024-06-06 ENCOUNTER — Ambulatory Visit: Payer: PRIVATE HEALTH INSURANCE | Admitting: Family Medicine

## 2024-06-12 ENCOUNTER — Ambulatory Visit: Payer: PRIVATE HEALTH INSURANCE | Admitting: Family Medicine

## 2024-06-23 ENCOUNTER — Telehealth: Payer: Self-pay | Admitting: Family Medicine

## 2024-06-23 NOTE — Telephone Encounter (Signed)
 ERROR

## 2024-06-27 ENCOUNTER — Ambulatory Visit: Payer: PRIVATE HEALTH INSURANCE | Admitting: Family Medicine

## 2024-07-28 ENCOUNTER — Encounter: Payer: PRIVATE HEALTH INSURANCE | Admitting: Family Medicine

## 2024-08-15 ENCOUNTER — Encounter: Payer: PRIVATE HEALTH INSURANCE | Admitting: Family Medicine

## 2024-08-29 ENCOUNTER — Ambulatory Visit: Payer: PRIVATE HEALTH INSURANCE | Admitting: Family Medicine

## 2024-10-02 ENCOUNTER — Ambulatory Visit: Payer: PRIVATE HEALTH INSURANCE | Admitting: Family Medicine

## 2024-11-06 ENCOUNTER — Ambulatory Visit: Payer: PRIVATE HEALTH INSURANCE | Admitting: Family Medicine

## 2024-12-12 ENCOUNTER — Ambulatory Visit: Payer: PRIVATE HEALTH INSURANCE | Admitting: Family Medicine

## 2024-12-13 ENCOUNTER — Encounter: Payer: Self-pay | Admitting: Family Medicine

## 2025-01-22 ENCOUNTER — Ambulatory Visit: Payer: PRIVATE HEALTH INSURANCE | Admitting: Family Medicine

## 2025-02-19 ENCOUNTER — Ambulatory Visit: Payer: PRIVATE HEALTH INSURANCE | Admitting: Family Medicine
